# Patient Record
Sex: Female | Born: 1985 | Race: White | Hispanic: No | Marital: Married | Smoking: Never smoker
Health system: Southern US, Community
[De-identification: ages and names within clinical notes are randomized; demographics above are authoritative.]

## PROBLEM LIST (undated history)

## (undated) DIAGNOSIS — N83519 Torsion of ovary and ovarian pedicle, unspecified side: Secondary | ICD-10-CM

## (undated) DIAGNOSIS — E0500 Thyrotoxicosis with diffuse goiter without thyrotoxic crisis or storm: Secondary | ICD-10-CM

## (undated) DIAGNOSIS — IMO0002 Reserved for concepts with insufficient information to code with codable children: Secondary | ICD-10-CM

## (undated) DIAGNOSIS — N83209 Unspecified ovarian cyst, unspecified side: Secondary | ICD-10-CM

## (undated) HISTORY — PX: LAMINECTOMY: SHX219

## (undated) HISTORY — PX: BACK SURGERY: SHX140

## (undated) HISTORY — PX: ANKLE SURGERY: SHX546

## (undated) HISTORY — PX: ELBOW SURGERY: SHX618

---

## 2013-03-18 ENCOUNTER — Ambulatory Visit (INDEPENDENT_AMBULATORY_CARE_PROVIDER_SITE_OTHER): Payer: 59 | Admitting: Sports Medicine

## 2013-03-18 ENCOUNTER — Encounter: Payer: Self-pay | Admitting: Sports Medicine

## 2013-03-18 VITALS — BP 123/84 | HR 97 | Ht 62.0 in | Wt 130.0 lb

## 2013-03-18 DIAGNOSIS — M549 Dorsalgia, unspecified: Secondary | ICD-10-CM

## 2013-03-18 DIAGNOSIS — M62830 Muscle spasm of back: Secondary | ICD-10-CM

## 2013-03-18 DIAGNOSIS — M25551 Pain in right hip: Secondary | ICD-10-CM | POA: Insufficient documentation

## 2013-03-18 DIAGNOSIS — M25559 Pain in unspecified hip: Secondary | ICD-10-CM

## 2013-03-18 DIAGNOSIS — M5380 Other specified dorsopathies, site unspecified: Secondary | ICD-10-CM

## 2013-03-18 DIAGNOSIS — M5126 Other intervertebral disc displacement, lumbar region: Secondary | ICD-10-CM | POA: Insufficient documentation

## 2013-03-18 MED ORDER — GABAPENTIN 300 MG PO CAPS: 300.0000 mg | ORAL_CAPSULE | Freq: Every day | ORAL | Status: DC

## 2013-03-18 NOTE — Assessment & Plan Note (Signed)
She is a compensatory muscle spasm the thoracic spine secondary to her lumbar disc herniation. Physical therapy and home exercises should help alleviate this problem.

## 2013-03-18 NOTE — Progress Notes (Signed)
  Subjective:    Patient ID: Dawn Maldonado, female    DOB: Oct 28, 1985, 27 y.o.   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  She completed anterior right hip pain for the past 3-4 years. Occasionally experiences a popping sensation with external rotation of her hip. The pain with internal rotation and abduction of her right hip. She denies any prior injury. She has had no prior evaluation for this. She reports it to be a nagging pain it has not limited her activity significantly.  She complains of right lower back pain which started in an accident 2 years ago. Showed L5 disc herniation. She has had several injections epidural injections into the disc. These have provided little to no relief. She occasionally reports a burning sensation in her right toe.  The burning sensation is occasionally bothersome at night as well.  No new weakness.  She complains of left lower thoracic back pain over the past several months with no significant injury. The pain is mild and only intermittent. She suspects this may be compensatory secondary to the pain she is experiencing on the right.  Past medical history: Graves' disease currently in remission.  Past surgical history: Epidural injections secondary to lumbar disc herniation  Review of Systems  Constitutional: Positive for activity change. Negative for fatigue and unexpected weight change.  Respiratory: Negative for cough and shortness of breath.   Cardiovascular: Negative for leg swelling.  Musculoskeletal: Positive for back pain and arthralgias. Negative for joint swelling and gait problem.  Neurological: Positive for numbness. Negative for weakness.   As per history of present illness and above otherwise negative    Objective:   Physical Exam BP 123/84  Pulse 97  Ht 5\' 2"  (1.575 m)  Wt 130 lb (58.968 kg)  BMI 23.77  kg/m2 This is a well-developed well-nourished 27 year old female awake alert oriented in no acute distress.  Spine: Normal visualization with no scoliosis No vertebral point tenderness ROM intact with flexion and extension Mild left thoracic paraspinous tenderness (T10-12), no spasm felt No Lumbar spasm Negative Straight leg raise  Right Hip: ROM IR: 40 Deg, ER: 45 Deg, Flexion: 120 Deg, Extension: 15 Deg, Abduction: 45 Deg, Adduction: 45 Deg Strength IR: 5/5, ER: 5/5, Flexion: 5/5, Extension: 5/5, Abduction: 5/5, Adduction: 5/5 Pelvic alignment unremarkable to inspection and palpation. Standing hip rotation and gait without trendelenburg / unsteadiness. Greater trochanter without tenderness to palpation. No tenderness over piriformis and greater trochanter. FADIR positive, FABER negative Mild SI joint tenderness and minimal SI movement R>L No sciatic notch tenderness  Musculoskeletal ultrasound was performed of the right hip. Images obtained of the acetabulum and femoral head reveal very mild calcific change to the labrum no evidence of acute tear and no evidence of effusion.

## 2013-03-18 NOTE — Assessment & Plan Note (Signed)
Flexion and core strengthening exercises were recommended. She will start physical therapy to work on this. She will start gabapentin 300 mg at bedtime. Followup in one month for reevaluation

## 2013-03-18 NOTE — Assessment & Plan Note (Signed)
History and examination are consistent with a possible prior hip labral injury. No evidence of effusion at this time. She will continue her current regimen with no intervention at this time.

## 2013-03-18 NOTE — Patient Instructions (Addendum)
  Exercises to perform at home: Pretzel stretching, Pelvic tilts, Flexion series exercises Hip rotation exercises Quadraped exercises

## 2013-03-26 ENCOUNTER — Ambulatory Visit: Payer: 59 | Attending: Sports Medicine | Admitting: Physical Therapy

## 2013-03-26 DIAGNOSIS — M545 Low back pain, unspecified: Secondary | ICD-10-CM | POA: Insufficient documentation

## 2013-03-26 DIAGNOSIS — IMO0001 Reserved for inherently not codable concepts without codable children: Secondary | ICD-10-CM | POA: Insufficient documentation

## 2013-03-26 DIAGNOSIS — M25559 Pain in unspecified hip: Secondary | ICD-10-CM | POA: Insufficient documentation

## 2013-04-02 ENCOUNTER — Ambulatory Visit: Payer: 59 | Admitting: Physical Therapy

## 2013-04-09 ENCOUNTER — Ambulatory Visit: Payer: 59 | Attending: Sports Medicine | Admitting: Physical Therapy

## 2013-04-09 DIAGNOSIS — M545 Low back pain, unspecified: Secondary | ICD-10-CM | POA: Insufficient documentation

## 2013-04-09 DIAGNOSIS — M25559 Pain in unspecified hip: Secondary | ICD-10-CM | POA: Insufficient documentation

## 2013-04-09 DIAGNOSIS — IMO0001 Reserved for inherently not codable concepts without codable children: Secondary | ICD-10-CM | POA: Insufficient documentation

## 2013-04-16 ENCOUNTER — Ambulatory Visit: Payer: 59 | Admitting: Physical Therapy

## 2013-04-20 ENCOUNTER — Ambulatory Visit (INDEPENDENT_AMBULATORY_CARE_PROVIDER_SITE_OTHER): Payer: 59 | Admitting: Sports Medicine

## 2013-04-20 ENCOUNTER — Encounter: Payer: Self-pay | Admitting: Sports Medicine

## 2013-04-20 VITALS — BP 117/75 | HR 98 | Ht 62.0 in | Wt 130.0 lb

## 2013-04-20 DIAGNOSIS — M5126 Other intervertebral disc displacement, lumbar region: Secondary | ICD-10-CM

## 2013-04-20 DIAGNOSIS — M549 Dorsalgia, unspecified: Secondary | ICD-10-CM

## 2013-04-20 MED ORDER — GABAPENTIN 300 MG PO CAPS: 600.0000 mg | ORAL_CAPSULE | Freq: Three times a day (TID) | ORAL | Status: DC

## 2013-04-20 NOTE — Assessment & Plan Note (Addendum)
Assessment: no weakness, only sensation problems related to nerve irritation of L5/S1 Plan: Continue PT once per week focusing on improving flexion; titrate gabapentin by 300 mg each week as tolerated to effect; f/u in 6 weeks  I do not see findings to suggest that surgical intervention is likely to be helpful  If she does get any weakness consider repeat MRI

## 2013-04-20 NOTE — Patient Instructions (Signed)
Dear Dawn Maldonado,   It was nice to meet you today. I am sorry that you are having continued pain. The best option at this point is to titrate the gabapentin by 300 mg per week. If it makes you fatigued, then stay at that dose.   Please follow up in 6 weeks.   Sincerely,   Dr. Clinton Sawyer

## 2013-04-20 NOTE — Progress Notes (Signed)
  Subjective:    Patient ID: Dawn Maldonado, female    DOB: 11/28/1985, 27 y.o.   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 She was seen on 03/18/13 and patient was encouraged to work on core strengthening and start taking Gabapentin 300 mg at bedtime for pain presumed 2/2 lumbar disc herniation residual. She was also recommended home PT exercises for treatment of muscle spasm.   In past she has failed to get relief with 3 Lumbar spine CSI injections  Today she reports that she has had no improvement or worsening of pain. Her pain is described as burning in her calf and foot. It is worsened by flexion exercises and sitting for a prolonged period of time. She denies any excess fatigue or side effects from gabapentin. She denies any new weakness or trauma.   Review of Systems See HPI      Objective:   Physical Exam BP 117/75  Pulse 98  Ht 5\' 2"  (1.575 m)  Wt 130 lb (58.968 kg)  BMI 23.77 kg/m2  Gen: well appearing, non distressed, pleasant and conversant Hip: right normal internal and external rotation, full flexion of hip with 5/5 strength of hip flexor and hip abductors  Back: negative straight leg raise test, negative faber's test, SI joint with normal laxity Neuro: sensation in touch to light touch and equal bilaterally on lower extremities, 2+ deep tendon reflexes of patella and achilles bilaterally, full strength of dorsi and plantar flexion of great toe on right, no atrophy of right calf muscles   Note her hip abduction is strong now      Assessment & Plan:

## 2013-04-23 ENCOUNTER — Ambulatory Visit: Payer: 59 | Admitting: Physical Therapy

## 2013-04-30 ENCOUNTER — Ambulatory Visit: Payer: 59 | Admitting: Physical Therapy

## 2013-05-12 ENCOUNTER — Ambulatory Visit (INDEPENDENT_AMBULATORY_CARE_PROVIDER_SITE_OTHER): Payer: 59 | Admitting: Sports Medicine

## 2013-05-12 ENCOUNTER — Telehealth: Payer: Self-pay | Admitting: *Deleted

## 2013-05-12 VITALS — BP 128/84 | Ht 62.0 in | Wt 130.0 lb

## 2013-05-12 DIAGNOSIS — M5126 Other intervertebral disc displacement, lumbar region: Secondary | ICD-10-CM

## 2013-05-12 DIAGNOSIS — M5416 Radiculopathy, lumbar region: Secondary | ICD-10-CM

## 2013-05-12 DIAGNOSIS — M51369 Other intervertebral disc degeneration, lumbar region without mention of lumbar back pain or lower extremity pain: Secondary | ICD-10-CM

## 2013-05-12 DIAGNOSIS — IMO0002 Reserved for concepts with insufficient information to code with codable children: Secondary | ICD-10-CM

## 2013-05-12 MED ORDER — AMITRIPTYLINE HCL 25 MG PO TABS: 25.0000 mg | ORAL_TABLET | Freq: Every day | ORAL | Status: DC

## 2013-05-12 NOTE — Telephone Encounter (Signed)
Message copied by Mora Bellman on Wed May 12, 2013 10:46 AM ------      Message from: CERESI, Shawna Orleans L      Created: Wed May 12, 2013  8:32 AM      Regarding: phone message       Pt is having increase pain and numbness rt foot/leg. She is concerned about the new symptoms.  Please call work number [phone]  ------

## 2013-05-12 NOTE — Telephone Encounter (Signed)
Pt able to come in for an appt this morning.

## 2013-05-12 NOTE — Progress Notes (Signed)
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 She has had a known bulging disc with L5-S1 symptoms for the last 2 years. She has had some chronic burning type pain in her right foot. However, in the last week she has had new symptoms. These include numbness of her right foot, increased burning pain in her right foot, and now some burning on her lateral right thigh. She notes her symptoms are worsened by sitting. More concerning like, she has developed some symptoms in her left foot now. She is having left foot burning pain and numbness. She denies any specific weakness but states that her right leg feels heavy. Her pain does improve some with walking but has been relatively constant recently. She denies any bowel or bladder symptoms or any fever.  ROS: As above in the HPI. All other systems are stable or negative.  OBJECTIVE: APPEARANCE:  Patient in no acute distress.The patient appeared well nourished and normally developed. HEENT: No scleral icterus. Conjunctiva non-injected Resp: Non labored Skin: No rash MSK:  Low back exam: - Full range of motion in flexion, extension, lateral bending, rotation without pain  - Minor tenderness to palpation at L5-S1 lumbar vertebra  - No tenderness to palpation at the SI joint or sciatic notch - Strength 5 out of 5 in the bilateral lower extremities - Reflexes 2+ bilaterally at patella kneecap   ASSESSMENT: #1. Known chronic lumbar disc herniation with L5-S1 right-sided symptoms with acute development of worsening right-sided symptoms and new left sided symptoms   PLAN: We will obtain an MRI to evaluate for any central disc bulge as this will likely alter course of management. This was scheduled for October 17. We will call patient with results of her study. In the meantime, we would like her to try to increase her  gabapentin to 600 mg each bedtime and 300 mg every morning. She was also given a prescription for amitriptyline 25 mg to take instead of the extra morning dose of gabapentin if she does not tolerate the gabapentin. Followup plan based on MRI results.

## 2013-05-12 NOTE — Telephone Encounter (Signed)
Per Dr. Darrick Penna - asked her if she could come for a work in appt this morning.  She will check her schedule as she is seeing pts.  If she cannot come in the morning, Dr. Darrick Penna said he will call her back this afternoon.

## 2013-05-12 NOTE — Patient Instructions (Addendum)
Thank you for coming in today  We will get an MRI of your back to see if there has been any change Try taking the gabapentin 600 mg at night and 300 mg in the morning. If this makes you too drowsy during the day we could try adding amitriptyline.  We will call you with results of MRI.  Add isometric back extensors and pelvic tilts.  You have been scheduled for a MRI at [facility] on 05/21/13 at 11 am, please arrive at 10:45am.   Please go to radiology on the 1st floor

## 2013-05-14 ENCOUNTER — Encounter: Payer: 59 | Admitting: Physical Therapy

## 2013-05-18 ENCOUNTER — Ambulatory Visit (HOSPITAL_COMMUNITY)
Admission: RE | Admit: 2013-05-18 | Discharge: 2013-05-18 | Disposition: A | Discharge status: Home / Self Care | Payer: 59 | Source: Ambulatory Visit | Attending: Family Medicine | Admitting: Family Medicine

## 2013-05-18 DIAGNOSIS — N83209 Unspecified ovarian cyst, unspecified side: Secondary | ICD-10-CM | POA: Insufficient documentation

## 2013-05-18 DIAGNOSIS — M5416 Radiculopathy, lumbar region: Secondary | ICD-10-CM

## 2013-05-18 DIAGNOSIS — M5137 Other intervertebral disc degeneration, lumbosacral region: Secondary | ICD-10-CM | POA: Insufficient documentation

## 2013-05-18 DIAGNOSIS — M79609 Pain in unspecified limb: Secondary | ICD-10-CM | POA: Insufficient documentation

## 2013-05-18 DIAGNOSIS — M51379 Other intervertebral disc degeneration, lumbosacral region without mention of lumbar back pain or lower extremity pain: Secondary | ICD-10-CM | POA: Insufficient documentation

## 2013-05-18 DIAGNOSIS — R209 Unspecified disturbances of skin sensation: Secondary | ICD-10-CM | POA: Insufficient documentation

## 2013-05-18 DIAGNOSIS — M5126 Other intervertebral disc displacement, lumbar region: Secondary | ICD-10-CM | POA: Insufficient documentation

## 2013-05-21 ENCOUNTER — Ambulatory Visit (HOSPITAL_COMMUNITY): Payer: 59

## 2013-05-21 ENCOUNTER — Ambulatory Visit: Payer: 59 | Attending: Sports Medicine | Admitting: Physical Therapy

## 2013-05-21 DIAGNOSIS — M545 Low back pain, unspecified: Secondary | ICD-10-CM | POA: Insufficient documentation

## 2013-05-21 DIAGNOSIS — IMO0001 Reserved for inherently not codable concepts without codable children: Secondary | ICD-10-CM | POA: Insufficient documentation

## 2013-05-21 DIAGNOSIS — M25559 Pain in unspecified hip: Secondary | ICD-10-CM | POA: Insufficient documentation

## 2013-05-28 ENCOUNTER — Ambulatory Visit: Payer: 59 | Admitting: Physical Therapy

## 2013-06-02 ENCOUNTER — Ambulatory Visit: Payer: 59 | Admitting: Sports Medicine

## 2013-06-04 ENCOUNTER — Ambulatory Visit: Payer: 59 | Admitting: Physical Therapy

## 2013-10-26 ENCOUNTER — Other Ambulatory Visit (HOSPITAL_COMMUNITY): Payer: Self-pay | Admitting: Neurosurgery

## 2013-10-26 DIAGNOSIS — M5416 Radiculopathy, lumbar region: Secondary | ICD-10-CM

## 2013-11-05 ENCOUNTER — Ambulatory Visit (HOSPITAL_COMMUNITY)
Admission: RE | Admit: 2013-11-05 | Discharge: 2013-11-05 | Disposition: A | Discharge status: Home / Self Care | Payer: 59 | Source: Ambulatory Visit | Attending: Neurosurgery | Admitting: Neurosurgery

## 2013-11-05 DIAGNOSIS — M5137 Other intervertebral disc degeneration, lumbosacral region: Secondary | ICD-10-CM | POA: Insufficient documentation

## 2013-11-05 DIAGNOSIS — M79609 Pain in unspecified limb: Secondary | ICD-10-CM | POA: Insufficient documentation

## 2013-11-05 DIAGNOSIS — IMO0002 Reserved for concepts with insufficient information to code with codable children: Secondary | ICD-10-CM | POA: Insufficient documentation

## 2013-11-05 DIAGNOSIS — M5126 Other intervertebral disc displacement, lumbar region: Secondary | ICD-10-CM | POA: Insufficient documentation

## 2013-11-05 DIAGNOSIS — M545 Low back pain, unspecified: Secondary | ICD-10-CM | POA: Insufficient documentation

## 2013-11-05 DIAGNOSIS — M51379 Other intervertebral disc degeneration, lumbosacral region without mention of lumbar back pain or lower extremity pain: Secondary | ICD-10-CM | POA: Insufficient documentation

## 2013-11-05 DIAGNOSIS — M5416 Radiculopathy, lumbar region: Secondary | ICD-10-CM

## 2013-11-09 ENCOUNTER — Other Ambulatory Visit (HOSPITAL_COMMUNITY): Payer: 59

## 2013-12-27 ENCOUNTER — Emergency Department (HOSPITAL_COMMUNITY): Payer: 59 | Admitting: Anesthesiology

## 2013-12-27 ENCOUNTER — Emergency Department (HOSPITAL_COMMUNITY): Payer: 59

## 2013-12-27 ENCOUNTER — Encounter (HOSPITAL_COMMUNITY): Admission: EM | Disposition: A | Discharge status: Home / Self Care | Payer: Self-pay | Source: Home / Self Care

## 2013-12-27 ENCOUNTER — Encounter (HOSPITAL_COMMUNITY): Payer: Self-pay | Admitting: Emergency Medicine

## 2013-12-27 ENCOUNTER — Ambulatory Visit (HOSPITAL_COMMUNITY)
Admission: EM | Admit: 2013-12-27 | Discharge: 2013-12-27 | Disposition: A | Discharge status: Home / Self Care | Payer: 59 | Attending: Obstetrics | Admitting: Obstetrics

## 2013-12-27 ENCOUNTER — Encounter (HOSPITAL_COMMUNITY): Payer: 59 | Admitting: Anesthesiology

## 2013-12-27 DIAGNOSIS — N949 Unspecified condition associated with female genital organs and menstrual cycle: Secondary | ICD-10-CM | POA: Insufficient documentation

## 2013-12-27 DIAGNOSIS — N83519 Torsion of ovary and ovarian pedicle, unspecified side: Secondary | ICD-10-CM

## 2013-12-27 DIAGNOSIS — E0500 Thyrotoxicosis with diffuse goiter without thyrotoxic crisis or storm: Secondary | ICD-10-CM | POA: Insufficient documentation

## 2013-12-27 DIAGNOSIS — N8353 Torsion of ovary, ovarian pedicle and fallopian tube: Secondary | ICD-10-CM | POA: Insufficient documentation

## 2013-12-27 HISTORY — DX: Torsion of ovary and ovarian pedicle, unspecified side: N83.519

## 2013-12-27 HISTORY — PX: LAPAROSCOPY: SHX197

## 2013-12-27 HISTORY — DX: Reserved for concepts with insufficient information to code with codable children: IMO0002

## 2013-12-27 HISTORY — DX: Thyrotoxicosis with diffuse goiter without thyrotoxic crisis or storm: E05.00

## 2013-12-27 HISTORY — DX: Unspecified ovarian cyst, unspecified side: N83.209

## 2013-12-27 LAB — COMPREHENSIVE METABOLIC PANEL
ALT: 9 U/L (ref 0–35)
AST: 19 U/L (ref 0–37)
Albumin: 4.1 g/dL (ref 3.5–5.2)
Alkaline Phosphatase: 43 U/L (ref 39–117)
BUN: 18 mg/dL (ref 6–23)
CHLORIDE: 103 meq/L (ref 96–112)
CO2: 24 meq/L (ref 19–32)
Calcium: 8.9 mg/dL (ref 8.4–10.5)
Creatinine, Ser: 0.72 mg/dL (ref 0.50–1.10)
GFR calc Af Amer: >90 mL/min (ref >90)
Glucose, Bld: 139 mg/dL — ABNORMAL HIGH (ref 70–99)
Potassium: 3.9 mEq/L (ref 3.7–5.3)
SODIUM: 139 meq/L (ref 137–147)
Total Protein: 7.1 g/dL (ref 6.0–8.3)

## 2013-12-27 LAB — TYPE AND SCREEN
ABO/RH(D): B NEG
Antibody Screen: NEGATIVE

## 2013-12-27 LAB — CBC WITH DIFFERENTIAL/PLATELET
BASOS ABS: 0 10*3/uL (ref 0.0–0.1)
BASOS PCT: 0 % (ref 0–1)
Basophils Absolute: 0 10*3/uL (ref 0.0–0.1)
Basophils Relative: 0 % (ref 0–1)
EOS ABS: 0 10*3/uL (ref 0.0–0.7)
Eosinophils Absolute: 0 10*3/uL (ref 0.0–0.7)
Eosinophils Relative: 0 % (ref 0–5)
Eosinophils Relative: 0 % (ref 0–5)
HCT: 37.6 % (ref 36.0–46.0)
HEMATOCRIT: 39.2 % (ref 36.0–46.0)
HEMOGLOBIN: 13.3 g/dL (ref 12.0–15.0)
Hemoglobin: 13.3 g/dL (ref 12.0–15.0)
LYMPHS ABS: 0.8 10*3/uL (ref 0.7–4.0)
LYMPHS PCT: 14 % (ref 12–46)
Lymphocytes Relative: 8 % — ABNORMAL LOW (ref 12–46)
Lymphs Abs: 1.2 10*3/uL (ref 0.7–4.0)
MCH: 30.2 pg (ref 26.0–34.0)
MCH: 31.1 pg (ref 26.0–34.0)
MCHC: 33.9 g/dL (ref 30.0–36.0)
MCHC: 35.4 g/dL (ref 30.0–36.0)
MCV: 89.1 fL (ref 78.0–100.0)
MCV: 88.1 fL (ref 78.0–100.0)
MONO ABS: 0.2 10*3/uL (ref 0.1–1.0)
MONOS PCT: 2 % — AB (ref 3–12)
Monocytes Absolute: 0.4 10*3/uL (ref 0.1–1.0)
Monocytes Relative: 4 % (ref 3–12)
NEUTROS ABS: 7.5 10*3/uL (ref 1.7–7.7)
NEUTROS PCT: 90 % — AB (ref 43–77)
Neutro Abs: 9.9 10*3/uL — ABNORMAL HIGH (ref 1.7–7.7)
Neutrophils Relative %: 82 % — ABNORMAL HIGH (ref 43–77)
PLATELETS: 258 10*3/uL (ref 150–400)
Platelets: 241 10*3/uL (ref 150–400)
RBC: 4.4 MIL/uL (ref 3.87–5.11)
RBC: 4.27 MIL/uL (ref 3.87–5.11)
RDW: 11.6 % (ref 11.5–15.5)
RDW: 11.5 % (ref 11.5–15.5)
WBC: 9.1 10*3/uL (ref 4.0–10.5)
WBC: 10.9 10*3/uL — ABNORMAL HIGH (ref 4.0–10.5)

## 2013-12-27 LAB — URINALYSIS, ROUTINE W REFLEX MICROSCOPIC
BILIRUBIN URINE: NEGATIVE
Glucose, UA: NEGATIVE mg/dL
Hgb urine dipstick: NEGATIVE
KETONES UR: 15 mg/dL — AB
LEUKOCYTES UA: NEGATIVE
Nitrite: NEGATIVE
PH: 7.5 (ref 5.0–8.0)
Protein, ur: NEGATIVE mg/dL
Specific Gravity, Urine: 1.024 (ref 1.005–1.030)
Urobilinogen, UA: 1 mg/dL (ref 0.0–1.0)

## 2013-12-27 LAB — WET PREP, GENITAL
Clue Cells Wet Prep HPF POC: NONE SEEN
TRICH WET PREP: NONE SEEN
WBC, Wet Prep HPF POC: NONE SEEN
YEAST WET PREP: NONE SEEN

## 2013-12-27 LAB — ABO/RH: ABO/RH(D): B NEG

## 2013-12-27 LAB — LIPASE, BLOOD: Lipase: 58 U/L (ref 11–59)

## 2013-12-27 LAB — POC URINE PREG, ED: Preg Test, Ur: NEGATIVE

## 2013-12-27 SURGERY — LAPAROSCOPY, DIAGNOSTIC
Anesthesia: General | Site: Abdomen | Laterality: Right

## 2013-12-27 MED ORDER — HEPARIN SODIUM (PORCINE) 5000 UNIT/ML IJ SOLN
INTRAMUSCULAR | Status: DC | PRN
  Administered 2013-12-27: 5000 [IU]

## 2013-12-27 MED ORDER — MIDAZOLAM HCL 2 MG/2ML IJ SOLN: 0.5000 mg | Freq: Once | INTRAMUSCULAR | Status: DC | PRN

## 2013-12-27 MED ORDER — KETOROLAC TROMETHAMINE 30 MG/ML IJ SOLN
INTRAMUSCULAR | Status: DC | PRN
  Administered 2013-12-27: 30 mg via INTRAVENOUS

## 2013-12-27 MED ORDER — KETOROLAC TROMETHAMINE 30 MG/ML IJ SOLN: 15.0000 mg | Freq: Once | INTRAMUSCULAR | Status: DC | PRN

## 2013-12-27 MED ORDER — DOCUSATE SODIUM 100 MG PO CAPS: 100.0000 mg | ORAL_CAPSULE | Freq: Two times a day (BID) | ORAL | Status: DC

## 2013-12-27 MED ORDER — SUCCINYLCHOLINE CHLORIDE 20 MG/ML IJ SOLN
INTRAMUSCULAR | Status: DC | PRN
  Administered 2013-12-27: 100 mg via INTRAVENOUS

## 2013-12-27 MED ORDER — LACTATED RINGERS IV SOLN
INTRAVENOUS | Status: DC
  Administered 2013-12-27 (×2): via INTRAVENOUS

## 2013-12-27 MED ORDER — SODIUM CHLORIDE 0.9 % IV BOLUS (SEPSIS)
1000.0000 mL | Freq: Once | INTRAVENOUS | Status: AC
  Administered 2013-12-27: 1000 mL via INTRAVENOUS

## 2013-12-27 MED ORDER — PROMETHAZINE HCL 25 MG/ML IJ SOLN: 6.2500 mg | INTRAMUSCULAR | Status: DC | PRN

## 2013-12-27 MED ORDER — HYDROMORPHONE HCL PF 1 MG/ML IJ SOLN
1.0000 mg | Freq: Once | INTRAMUSCULAR | Status: AC
  Administered 2013-12-27: 1 mg via INTRAVENOUS
  Filled 2013-12-27: qty 1

## 2013-12-27 MED ORDER — OXYCODONE-ACETAMINOPHEN 5-325 MG PO TABS: 2.0000 | ORAL_TABLET | ORAL | Status: DC | PRN

## 2013-12-27 MED ORDER — ROCURONIUM BROMIDE 100 MG/10ML IV SOLN
INTRAVENOUS | Status: DC | PRN
  Administered 2013-12-27: 25 mg via INTRAVENOUS
  Administered 2013-12-27: 10 mg via INTRAVENOUS
  Administered 2013-12-27 (×4): 5 mg via INTRAVENOUS

## 2013-12-27 MED ORDER — CEFAZOLIN SODIUM-DEXTROSE 2-3 GM-% IV SOLR
2.0000 g | Freq: Once | INTRAVENOUS | Status: AC
  Administered 2013-12-27: 2 g via INTRAVENOUS
  Filled 2013-12-27 (×2): qty 50

## 2013-12-27 MED ORDER — FENTANYL CITRATE 0.05 MG/ML IJ SOLN
INTRAMUSCULAR | Status: DC | PRN
  Administered 2013-12-27: 50 ug via INTRAVENOUS
  Administered 2013-12-27: 100 ug via INTRAVENOUS
  Administered 2013-12-27 (×2): 50 ug via INTRAVENOUS

## 2013-12-27 MED ORDER — NEOSTIGMINE METHYLSULFATE 10 MG/10ML IV SOLN
INTRAVENOUS | Status: DC | PRN
  Administered 2013-12-27: 3 mg via INTRAVENOUS

## 2013-12-27 MED ORDER — HEPARIN SODIUM (PORCINE) 5000 UNIT/ML IJ SOLN
INTRAMUSCULAR | Status: AC
  Filled 2013-12-27: qty 1

## 2013-12-27 MED ORDER — ONDANSETRON 4 MG PO TBDP
8.0000 mg | ORAL_TABLET | Freq: Once | ORAL | Status: AC
  Administered 2013-12-27: 8 mg via ORAL
  Filled 2013-12-27: qty 2

## 2013-12-27 MED ORDER — IOHEXOL 300 MG/ML  SOLN
25.0000 mL | Freq: Once | INTRAMUSCULAR | Status: AC | PRN
  Administered 2013-12-27: 25 mL via ORAL

## 2013-12-27 MED ORDER — MORPHINE SULFATE 4 MG/ML IJ SOLN
4.0000 mg | Freq: Once | INTRAMUSCULAR | Status: AC
  Administered 2013-12-27: 4 mg via INTRAVENOUS
  Filled 2013-12-27: qty 1

## 2013-12-27 MED ORDER — CITRIC ACID-SODIUM CITRATE 334-500 MG/5ML PO SOLN
30.0000 mL | Freq: Once | ORAL | Status: AC
  Administered 2013-12-27: 30 mL via ORAL
  Filled 2013-12-27: qty 15

## 2013-12-27 MED ORDER — BUPIVACAINE HCL (PF) 0.25 % IJ SOLN
INTRAMUSCULAR | Status: DC | PRN
  Administered 2013-12-27: 30 mL

## 2013-12-27 MED ORDER — GLYCOPYRROLATE 0.2 MG/ML IJ SOLN
INTRAMUSCULAR | Status: DC | PRN
  Administered 2013-12-27: .4 mg via INTRAVENOUS

## 2013-12-27 MED ORDER — KETOROLAC TROMETHAMINE 30 MG/ML IJ SOLN
30.0000 mg | Freq: Once | INTRAMUSCULAR | Status: AC
  Administered 2013-12-27: 30 mg via INTRAVENOUS
  Filled 2013-12-27: qty 1

## 2013-12-27 MED ORDER — IOHEXOL 300 MG/ML  SOLN
80.0000 mL | Freq: Once | INTRAMUSCULAR | Status: AC | PRN
  Administered 2013-12-27: 80 mL via INTRAVENOUS

## 2013-12-27 MED ORDER — HYDROMORPHONE HCL PF 1 MG/ML IJ SOLN: 0.2500 mg | INTRAMUSCULAR | Status: DC | PRN

## 2013-12-27 MED ORDER — ONDANSETRON HCL 4 MG/2ML IJ SOLN
INTRAMUSCULAR | Status: DC | PRN
  Administered 2013-12-27: 4 mg via INTRAVENOUS

## 2013-12-27 MED ORDER — MIDAZOLAM HCL 2 MG/2ML IJ SOLN
INTRAMUSCULAR | Status: DC | PRN
  Administered 2013-12-27: 2 mg via INTRAVENOUS

## 2013-12-27 MED ORDER — FAMOTIDINE IN NACL 20-0.9 MG/50ML-% IV SOLN
20.0000 mg | Freq: Once | INTRAVENOUS | Status: AC
  Administered 2013-12-27: 20 mg via INTRAVENOUS
  Filled 2013-12-27: qty 50

## 2013-12-27 MED ORDER — LIDOCAINE HCL (CARDIAC) 20 MG/ML IV SOLN
INTRAVENOUS | Status: DC | PRN
  Administered 2013-12-27: 60 mg via INTRAVENOUS

## 2013-12-27 MED ORDER — ONDANSETRON HCL 4 MG/2ML IJ SOLN
4.0000 mg | INTRAMUSCULAR | Status: AC
  Administered 2013-12-27: 4 mg via INTRAVENOUS
  Filled 2013-12-27: qty 2

## 2013-12-27 MED ORDER — PROPOFOL 10 MG/ML IV BOLUS
INTRAVENOUS | Status: DC | PRN
  Administered 2013-12-27: 200 mg via INTRAVENOUS

## 2013-12-27 MED ORDER — MEPERIDINE HCL 25 MG/ML IJ SOLN: 6.2500 mg | INTRAMUSCULAR | Status: DC | PRN

## 2013-12-27 MED ORDER — LACTATED RINGERS IR SOLN
Status: DC | PRN
  Administered 2013-12-27: 3000 mL

## 2013-12-27 SURGICAL SUPPLY — 33 items
BLADE 15 SAFETY STRL DISP (BLADE) ×3 IMPLANT
CABLE HIGH FREQUENCY MONO STRZ (ELECTRODE) IMPLANT
CHLORAPREP W/TINT 26ML (MISCELLANEOUS) ×3 IMPLANT
CLOTH BEACON ORANGE TIMEOUT ST (SAFETY) ×3 IMPLANT
DERMABOND ADVANCED (GAUZE/BANDAGES/DRESSINGS) ×2
DERMABOND ADVANCED .7 DNX12 (GAUZE/BANDAGES/DRESSINGS) ×1 IMPLANT
ELECT NEEDLE TIP 2.8 STRL (NEEDLE) ×3 IMPLANT
FILTER SMOKE EVAC LAPAROSHD (FILTER) ×3 IMPLANT
FORCEPS CUTTING 33CM 5MM (CUTTING FORCEPS) ×3 IMPLANT
GLOVE BIO SURGEON STRL SZ 6.5 (GLOVE) ×2 IMPLANT
GLOVE BIO SURGEONS STRL SZ 6.5 (GLOVE) ×1
GLOVE BIOGEL PI IND STRL 7.0 (GLOVE) ×1 IMPLANT
GLOVE BIOGEL PI INDICATOR 7.0 (GLOVE) ×2
GOWN STRL REUS W/TWL LRG LVL3 (GOWN DISPOSABLE) ×9 IMPLANT
MANIPULATOR UTERINE 4.5 ZUMI (MISCELLANEOUS) ×3 IMPLANT
NEEDLE INSUFFLATION 120MM (ENDOMECHANICALS) ×3 IMPLANT
PACK LAPAROSCOPY BASIN (CUSTOM PROCEDURE TRAY) ×3 IMPLANT
PENCIL BUTTON HOLSTER BLD 10FT (ELECTRODE) ×3 IMPLANT
POUCH SPECIMEN RETRIEVAL 10MM (ENDOMECHANICALS) ×3 IMPLANT
PROTECTOR NERVE ULNAR (MISCELLANEOUS) ×3 IMPLANT
SCISSORS LAP 5X35 DISP (ENDOMECHANICALS) ×3 IMPLANT
SET IRRIG TUBING LAPAROSCOPIC (IRRIGATION / IRRIGATOR) ×3 IMPLANT
SOLUTION ELECTROLUBE (MISCELLANEOUS) IMPLANT
SUT VICRYL 0 UR6 27IN ABS (SUTURE) ×3 IMPLANT
SUT VICRYL 4-0 PS2 18IN ABS (SUTURE) ×6 IMPLANT
SYR 5ML LL (SYRINGE) ×3 IMPLANT
TOWEL OR 17X24 6PK STRL BLUE (TOWEL DISPOSABLE) ×6 IMPLANT
TRAY FOLEY CATH 14FR (SET/KITS/TRAYS/PACK) ×3 IMPLANT
TROCAR BALLN 12MMX100 BLUNT (TROCAR) IMPLANT
TROCAR XCEL NON-BLD 11X100MML (ENDOMECHANICALS) ×3 IMPLANT
TROCAR XCEL NON-BLD 5MMX100MML (ENDOMECHANICALS) ×6 IMPLANT
WARMER LAPAROSCOPE (MISCELLANEOUS) ×3 IMPLANT
WATER STERILE IRR 1000ML POUR (IV SOLUTION) ×3 IMPLANT

## 2013-12-27 NOTE — Anesthesia Postprocedure Evaluation (Signed)
  Anesthesia Post Note  Patient: Dawn Maldonado  Procedure(s) Performed: Procedure(s) (LRB): LAPAROSCOPIC SALPINGO OOPHORECTOMY (Right)  Anesthesia type: GA  Patient location: PACU  Post pain: Pain level controlled  Post assessment: Post-op Vital signs reviewed  Last Vitals:  Filed Vitals:   12/27/13 1345  BP: 108/66  Pulse: 93  Temp:   Resp: 19    Post vital signs: Reviewed  Level of consciousness: sedated  Complications: No apparent anesthesia complications

## 2013-12-27 NOTE — ED Notes (Signed)
RLQ pain starting 3 hours ago described as a severe sharp pain that travel to back and down to vagina. Sitting up and lying on back makes pain worse. LMP a week ago, states was light.

## 2013-12-27 NOTE — ED Provider Notes (Signed)
CSN: [id number]     Arrival date & time 12/27/13  0037 History   First MD Initiated Contact with Patient 12/27/13 0133     Chief Complaint  Patient presents with  . Abdominal Pain    (Consider location/radiation/quality/duration/timing/severity/associated sxs/prior Treatment) HPI Comments: Patient is a 28 year old female with a history of laminectomy 5 weeks ago who presents to the emergency department for right lower quadrant pain with onset 3 hours ago. Patient states the pain is severe and sharp in nature and travels to her left lower quadrant, around to her right back, and feels as though it radiates down to her vagina. Patient states the pain is worse when lying on her back and when sitting up. Pain was not alleviated by Percocet prior to arrival. Patient states the symptoms have been associated with chills, nausea, nonbloody emesis x3, and diaphoresis. Patient denies associated fever, chest pain, shortness of breath, syncope, dysuria, hematuria, vaginal bleeding, vaginal discharge, diarrhea, melena, hematochezia, numbness/tingling, weakness, and bowel/bladder incontinence. Patient had a bowel movement within the last 24 hours which was normal in color and consistency. LMP 1.5 weeks ago. No hx of abdominal surgeries.  Patient is a 28 y.o. female presenting with abdominal pain. The history is provided by the patient. No language interpreter was used.  Abdominal Pain Associated symptoms: chills, nausea and vomiting   Associated symptoms: no chest pain, no diarrhea, no dysuria, no hematuria, no shortness of breath, no vaginal bleeding and no vaginal discharge     Past Medical History  Diagnosis Date  . Graves disease   . Ovarian cyst    Past Surgical History  Procedure Laterality Date  . Back surgery    . Laminectomy    . Ankle surgery    . Elbow surgery     No family history on file. History  Substance Use Topics  . Smoking status: Never Smoker   . Smokeless tobacco: Never Used   . Alcohol Use: No   OB History   Grav Para Term Preterm Abortions TAB SAB Ect Mult Living                  Review of Systems  Constitutional: Positive for chills and diaphoresis.  Respiratory: Negative for shortness of breath.   Cardiovascular: Negative for chest pain.  Gastrointestinal: Positive for nausea, vomiting and abdominal pain. Negative for diarrhea and blood in stool.  Genitourinary: Negative for dysuria, hematuria, vaginal bleeding and vaginal discharge.  Neurological: Negative for weakness and numbness.  All other systems reviewed and are negative.   Allergies  Review of patient's allergies indicates no known allergies.  Home Medications   Prior to Admission medications   Medication Sig Start Date End Date Taking? Authorizing Provider  HYDROcodone-acetaminophen (NORCO/VICODIN) 5-325 MG per tablet Take 2 tablets by mouth every 6 (six) hours as needed for moderate pain.   Yes Historical Provider, MD  methimazole (TAPAZOLE) 5 MG tablet Take 5 mg by mouth 3 (three) times a week. Monday, Wednesday and friday   Yes Historical Provider, MD  naproxen sodium (ANAPROX) 220 MG tablet Take 440 mg by mouth 2 (two) times daily as needed (pain).   Yes Historical Provider, MD  norethindrone-ethinyl estradiol (JUNEL FE,GILDESS FE,LOESTRIN FE) 1-20 MG-MCG tablet Take 1 tablet by mouth daily.   Yes Historical Provider, MD   BP 99/54  Pulse 80  Temp(Src) 97.6 F (36.4 C) (Oral)  Resp 16  SpO2 95%  LMP 12/13/2013  Physical Exam  Nursing note and vitals reviewed. Constitutional:  She is oriented to person, place, and time. She appears well-developed and well-nourished. No distress.  Nontoxic/nonseptic appearing. Patient does appear uncomfortable.  HENT:  Head: Normocephalic and atraumatic.  Mouth/Throat: Oropharynx is clear and moist. No oropharyngeal exudate.  Eyes: Conjunctivae and EOM are normal. Pupils are equal, round, and reactive to light. No scleral icterus.  Neck: Normal  range of motion.  Cardiovascular: Normal rate, regular rhythm and normal heart sounds.   Pulmonary/Chest: Effort normal and breath sounds normal. No respiratory distress. She has no wheezes. She has no rales.  Abdominal: Soft. She exhibits no distension. There is tenderness. There is no rebound and no guarding.  Abdomen is soft with tenderness to deep palpation appreciated to be at McBurney's point; no exquisite TTP despite endorsed degree of pain. No masses. No peritoneal signs.  Genitourinary: Vagina normal. There is no rash, tenderness, lesion or injury on the right labia. There is no rash, tenderness, lesion or injury on the left labia. Uterus is not tender. Cervix exhibits no motion tenderness, no discharge and no friability. Right adnexum displays tenderness (very mild). Right adnexum displays no mass and no fullness. Left adnexum displays no mass, no tenderness and no fullness.  Musculoskeletal: Normal range of motion.  Well healed midline incision to lumbar spine. No TTP, swelling, erythema, heat to touch, or drainage.  Neurological: She is alert and oriented to person, place, and time.  Patient moves extremities without ataxia. GCS 15. Speech is goal oriented.  Skin: Skin is warm and dry. No rash noted. She is not diaphoretic. No erythema.  Psychiatric: She has a normal mood and affect. Her behavior is normal.    ED Course  Procedures (including critical care time) Labs Review Labs Reviewed  CBC WITH DIFFERENTIAL - Abnormal; Notable for the following:    Neutrophils Relative % 82 (*)    All other components within normal limits  COMPREHENSIVE METABOLIC PANEL - Abnormal; Notable for the following:    Glucose, Bld 139 (*)    Total Bilirubin <0.2 (*)    All other components within normal limits  URINALYSIS, ROUTINE W REFLEX MICROSCOPIC - Abnormal; Notable for the following:    APPearance CLOUDY (*)    Ketones, ur 15 (*)    All other components within normal limits  WET PREP,  GENITAL  GC/CHLAMYDIA PROBE AMP  LIPASE, BLOOD  POC URINE PREG, ED   Imaging Review Ct Abdomen Pelvis W Contrast  12/27/2013   CLINICAL DATA:  Right lower quadrant pain.  EXAM: CT ABDOMEN AND PELVIS WITH CONTRAST  TECHNIQUE: Multidetector CT imaging of the abdomen and pelvis was performed using the standard protocol following bolus administration of intravenous contrast.  CONTRAST:  29mL OMNIPAQUE IOHEXOL 300 MG/ML SOLN, 78mL OMNIPAQUE IOHEXOL 300 MG/ML SOLN  COMPARISON:  DG LUMBAR SPINE 1V dated 11/18/2013; MR L SPINE W/O dated 11/05/2013  FINDINGS: Included view of the lung bases are clear. Visualized heart and pericardium are unremarkable.  The liver, spleen, gallbladder, pancreas and adrenal glands are unremarkable.  The stomach, small and large bowel are normal in course and caliber without inflammatory changes. Moderate amount of retained large bowel stool. Enteric contrast has not yet reached the distal small bowel. Normal air-filled appendix. No intraperitoneal free fluid nor free air.  Kidneys are orthotopic, demonstrating symmetric enhancement without nephrolithiasis, hydronephrosis or renal masses. The unopacified ureters are normal in course and caliber. Urinary bladder is partially distended and unremarkable.  Aortoiliac vessels are normal in course and caliber. No lymphadenopathy by CT size  criteria. The right ovary appears somewhat prominent at 4.9 x 3.9 cm. Grade 1 L5-S1 retrolisthesis with moderate L5-S1 degenerative disc disease, status post right L5 laminectomy.  IMPRESSION: Mildly prominent right adnexae, if clinically indicated this could be further characterized with pelvic ultrasound.  Moderate amount of retained large bowel stool.  Normal appendix.   Electronically Signed   By: Awilda Metro   On: 12/27/2013 04:43     EKG Interpretation None      MDM   Final diagnoses:  Right lower quadrant abdominal pain    28 year old female presents for right lower quadrant pain with  onset 2130 yesterday. Symptoms associated with nausea, vomiting, and chills. Patient is 5 weeks s/p laminectomy for a ruptured disc in lumbar spine. She does endorse a hx of ovarian cysts, but states this pain feels different and her cyst pain has never been this severe. Physical exam significant for tenderness to palpation at McBurney point. Patient appears uncomfortable, though her laboratory workup is reassuring. Vital signs stable. Patient afebrile.  CT abdomen and pelvis ordered for further evaluation of symptoms. CT imaging shows a mildly prominent right adnexa. Given location of patient's symptoms, have ordered pelvic ultrasound for further evaluation and to rule out ovarian torsion. Ultrasound is currently pending. Patient signed out to [facility], PA-C at shift change who will follow up on imaging and disposition appropriately. Believe patient stable for outpatient f/u if no emergent process identified.   Filed Vitals:   12/27/13 0300 12/27/13 0314 12/27/13 0330 12/27/13 0545  BP: 109/70 109/70 103/63 99/54  Pulse: 78 77 79 80  Temp:      TempSrc:      Resp:   15 16  SpO2: 100% 99% 95% 95%       Antony Madura, PA-C 12/27/13 [phone]

## 2013-12-27 NOTE — Progress Notes (Signed)
Faculty Practice OB/GYN Attending Progress Note  Patient was transferred from [facility] ED with concern about ovarian torsion. See below:   12/27/2013   TRANSABDOMINAL AND TRANSVAGINAL ULTRASOUND OF PELVIS  DOPPLER ULTRASOUND OF OVARIES CLINICAL DATA:  Right lower quadrant abdominal pain.  TECHNIQUE: Both transabdominal and transvaginal ultrasound examinations of the pelvis were performed. Transabdominal technique was performed for global imaging of the pelvis including uterus, ovaries, adnexal regions, and pelvic cul-de-sac.  It was necessary to proceed with endovaginal exam following the transabdominal exam to visualize the ovaries and uterus. Color and duplex Doppler ultrasound was utilized to evaluate blood flow to the ovaries.  COMPARISON:  CT scan dated 12/27/2013  FINDINGS: Uterus  Measurements: 5.6 x 3.4 x 3.9 cm. No fibroids or other mass visualized.  Endometrium  Thickness: 4.1 mm.  No focal abnormality visualized.  Right ovary  Measurements: 7.4 x 4.2 x 5.9 cm. The ovary is enlarged and heterogeneous with minimal blood flow. I suspect this represents a partial torsion.  Left ovary  Measurements: 3.2 x 2.1 x 2.3 cm. Simple appearing 2.1 cm cyst. Normal perfusion.  Other findings:  Small amount of free fluid around the right ovary.  IMPRESSION: Enlarged right ovary with poor blood flow consistent with partial tortion.  Critical Value/emergent results were called by telephone at the time of interpretation on 12/27/2013 at 7:26 AM to Dr. Judd Lien  , who verbally acknowledged these results.   Electronically Signed   By: Geanie Cooley M.D.   On: 12/27/2013 07:26   12/27/2013  CT ABDOMEN AND PELVIS WITH CONTRAST  CLINICAL DATA:  Right lower quadrant pain.     TECHNIQUE: Multidetector CT imaging of the abdomen and pelvis was performed using the standard protocol following bolus administration of intravenous contrast.  CONTRAST:  82mL OMNIPAQUE IOHEXOL 300 MG/ML SOLN, 28mL OMNIPAQUE IOHEXOL 300 MG/ML SOLN  COMPARISON:  DG  LUMBAR SPINE 1V dated 11/18/2013; MR L SPINE W/O dated 11/05/2013  FINDINGS: Included view of the lung bases are clear. Visualized heart and pericardium are unremarkable.  The liver, spleen, gallbladder, pancreas and adrenal glands are unremarkable.  The stomach, small and large bowel are normal in course and caliber without inflammatory changes. Moderate amount of retained large bowel stool. Enteric contrast has not yet reached the distal small bowel. Normal air-filled appendix. No intraperitoneal free fluid nor free air.  Kidneys are orthotopic, demonstrating symmetric enhancement without nephrolithiasis, hydronephrosis or renal masses. The unopacified ureters are normal in course and caliber. Urinary bladder is partially distended and unremarkable.  Aortoiliac vessels are normal in course and caliber. No lymphadenopathy by CT size criteria. The right ovary appears somewhat prominent at 4.9 x 3.9 cm. Grade 1 L5-S1 retrolisthesis with moderate L5-S1 degenerative disc disease, status post right L5 laminectomy.  IMPRESSION: Mildly prominent right adnexae, if clinically indicated this could be further characterized with pelvic ultrasound.  Moderate amount of retained large bowel stool.  Normal appendix.   Electronically Signed   By: Awilda Metro   On: 12/27/2013 04:43    Patient reported she is seen at [facility] by Dr. Noland Fordyce.  Dr. Ernestina Penna was called and informed of the patient; she will take over her care.  Of note, Dilaudid was reordered for patient.  OR was already getting ready for patient; they were informed of change of attending/service.    Jaynie Collins, MD, FACOG Attending Obstetrician & Gynecologist Faculty Practice, [facility]

## 2013-12-27 NOTE — ED Notes (Signed)
Pa  at bedside. 

## 2013-12-27 NOTE — H&P (Signed)
28 yo G1 with history of ovarian cysts, currently on COC suppression, with acute onset abdominal pain w/ nausea and emesis starting 9 pm last night. Went to ER at midnight for persistent pain. Pt was suspicious of ovarian cyst as she has had prior.   In ER pt was neg UPT, WBC 10, nl Hgb. CT w/ nl appy, moderate stool burden, no lymphadenopathy. Prominent R adnexa on CT was followed by u/s showing 7x4x5 heterogenous R ovary w/ limited blood flow c/w partial torsion. Pt has been managed w/ IV narcotics th rough the night but continued pain.  Of note, pt is 5 wks from lumbar laminectomy.  PMH: Graves, ovarian cysts, back pain Past Medical History  Diagnosis Date  . Graves disease   . Ovarian cyst   . Ovarian torsion   . Herniated disc     PSH: lumbar laminectomy, elbow and ankle surgery Past Surgical History  Procedure Laterality Date  . Back surgery    . Laminectomy    . Ankle surgery    . Elbow surgery    . Back surgery      Meds: COC, methimazole All: benzoin  PE: Filed Vitals:   12/27/13 0846  BP: 125/76  Pulse: 86  Temp: 97.9 F (36.6 C)  Resp: 18   Gen: sitting in bed, hunched, noting abd pain Back: surgical scar present, no CVAT Abd: tender diffusely, voluntary guarding LE: NT, no edema GU: def to OR  CBC    Component Value Date/Time   WBC 10.9* 12/27/2013 0850   RBC 4.27 12/27/2013 0850   HGB 13.3 12/27/2013 0850   HCT 37.6 12/27/2013 0850   PLT 241 12/27/2013 0850   MCV 88.1 12/27/2013 0850   MCH 31.1 12/27/2013 0850   MCHC 35.4 12/27/2013 0850   RDW 11.5 12/27/2013 0850   LYMPHSABS 0.8 12/27/2013 0850   MONOABS 0.2 12/27/2013 0850   EOSABS 0.0 12/27/2013 0850   BASOSABS 0.0 12/27/2013 0850     Imaging: see results  A/P: Probable R ovarian torsion. D/w pt diagnostic l'scope with ovarian cystectomy (with possible RSO) vs expectant management with continued inpt observation. I d/w pt risks of RSO and possible fertility implications of this. We would only proceed w/  RSO if needed for bleeding, technically unable to proceed w/ cystectomy alone, concern for cancer, or absolutely no reperfusion of R ovary post torsion. If possible reperfusion clinically, I d/w pt option of RSO vs expectant and recommended the latter as most ovaries will improve over time. Pt aware risk of leaving ovary is needing a reoperation and agrees to this. We also discussed that if cystectomy alone, spillage of cyst fluid can cause adhesion formation and if cancer present can upstage. Also d/w pt that no torsion will be seen during OR but we would likely still proceed w/ cystectomy to prevent torsion as she is likely having intermittent torsion. Other non-surgical etiologies may be the cause of her pain (like her  Large stool burden) but given the u/s findings I recc proceeding to OR.  Dawn Maldonado Dawn Maldonado 12/27/2013 10:01 AM

## 2013-12-27 NOTE — Discharge Instructions (Signed)

## 2013-12-27 NOTE — ED Provider Notes (Signed)
Medical screening examination/treatment/procedure(s) were performed by non-physician practitioner and as supervising physician I was immediately available for consultation/collaboration.     Kaveon Blatz, MD 12/27/13 0636 

## 2013-12-27 NOTE — ED Notes (Signed)
Updated patient on wait for [place].

## 2013-12-27 NOTE — ED Notes (Signed)
C/o sharp RLQ pain since 9:30pm with nausea, vomited x 2, and diaphoresis.  Pt had back surgery 5 weeks ago.  C/o frequent urination with only a small amount of urine.

## 2013-12-27 NOTE — ED Notes (Signed)
Pt given Zofran and then started vomiting.

## 2013-12-27 NOTE — Anesthesia Preprocedure Evaluation (Signed)
Anesthesia Evaluation  Patient identified by MRN, date of birth, ID band Patient awake    Reviewed: Allergy & Precautions, H&P , Patient's Chart, lab work & pertinent test results, reviewed documented beta blocker date and time   History of Anesthesia Complications Negative for: history of anesthetic complications  Airway Mallampati: II TM Distance: >3 FB Neck ROM: full    Dental   Pulmonary  breath sounds clear to auscultation        Cardiovascular Exercise Tolerance: Good Rhythm:regular Rate:Normal     Neuro/Psych    GI/Hepatic   Endo/Other  Hyperthyroidism   Renal/GU      Musculoskeletal   Abdominal   Peds  Hematology   Anesthesia Other Findings   Reproductive/Obstetrics                           Anesthesia Physical Anesthesia Plan  ASA: II and emergent  Anesthesia Plan: General ETT   Post-op Pain Management:    Induction: Rapid sequence, Cricoid pressure planned and Intravenous  Airway Management Planned: Oral ETT  Additional Equipment:   Intra-op Plan:   Post-operative Plan:   Informed Consent: I have reviewed the patients History and Physical, chart, labs and discussed the procedure including the risks, benefits and alternatives for the proposed anesthesia with the patient or authorized representative who has indicated his/her understanding and acceptance.   Dental Advisory Given  Plan Discussed with: CRNA and Surgeon  Anesthesia Plan Comments:         Anesthesia Quick Evaluation

## 2013-12-27 NOTE — ED Notes (Signed)
Contrast completed. CT notified

## 2013-12-27 NOTE — Brief Op Note (Signed)
12/27/2013  7:53 PM  PATIENT:  Dawn Maldonado  28 y.o. female  PRE-OPERATIVE DIAGNOSIS: right  ovarian torsion, acute pelvic pain  POST-OPERATIVE DIAGNOSIS:  right  ovarian torsion, acute pelvic pain, necrotic heterogenous right ovary  PROCEDURE:  Procedure(s): LAPAROSCOPIC SALPINGO OOPHORECTOMY AND PELVIC WASHINGS (Right)  SURGEON:  Surgeon(s) and Role:    Tresa Endo A. Ernestina Penna, MD - Primary  PHYSICIAN ASSISTANT:   ASSISTANTSDenyse Amass, CNM   ANESTHESIA:   general  EBL:   per anasthesia records, minimal  BLOOD ADMINISTERED:none  DRAINS: none   LOCAL MEDICATIONS USED:  MARCAINE, 1/4%, 10 used at skin sites, 20 cc placed in peritoneal cavity at the end of the case  SPECIMEN:  Source of Specimen:  Right tube and ovary, pelvic washings  DISPOSITION OF SPECIMEN:  PATHOLOGY  COUNTS:  YES  TOURNIQUET:  * No tourniquets in log *  DICTATION: .Note written in EPIC  PLAN OF CARE: Discharge to home after PACU  PATIENT DISPOSITION:  PACU - hemodynamically stable.   Delay start of Pharmacological VTE agent (>24hrs) due to surgical blood loss or risk of bleeding: yes

## 2013-12-27 NOTE — ED Provider Notes (Signed)
PROGRESS NOTE                                                                                                                 This is a sign-out from PA Humes at shift change: Dawn Maldonado is a 28 y.o. female presenting with RLQ pain N/V, chills. Non surgical abd. Plan is to follow up US to r/o Torsion.  Please refer to previous note for full HPI, ROS, PMH and PE.   Emergent results called by radiologist to attending physician Dr. Judd Lien: Pelvic ultrasound shows enlargement and produced blood flow to right ovary, consistent with torsion.  GYN consult from Dr. Erin Fulling appreciated: Recommends immediate transfer. Patient informed, all questions answered. Advised patient to remain n.p.o., pain is still severe, abdomen remains nonsurgical. Last by mouth intake was last night at dinner. States she vomited up the food. Has had nothing in the last 12 hours.   Dawn Emery, PA-C 12/27/13 [phone]

## 2013-12-27 NOTE — Op Note (Signed)
12/27/2013  7:53 PM  PATIENT:  Dawn Maldonado  28 y.o. female  PRE-OPERATIVE DIAGNOSIS: right  ovarian torsion, acute pelvic pain  POST-OPERATIVE DIAGNOSIS:  right  ovarian torsion, acute pelvic pain, necrotic heterogenous right ovary  PROCEDURE:  Procedure(s): LAPAROSCOPIC SALPINGO OOPHORECTOMY AND PELVIC WASHINGS (Right)  SURGEON:  Surgeon(s) and Role:    Tresa Endo A. Ernestina Penna, MD - Primary  PHYSICIAN ASSISTANT:   ASSISTANTSDenyse Amass, CNM   ANESTHESIA:   general  EBL:   per anasthesia records, minimal  BLOOD ADMINISTERED:none  DRAINS: none   LOCAL MEDICATIONS USED:  MARCAINE, 1/4%, 10 used at skin sites, 20 cc placed in peritoneal cavity at the end of the case  SPECIMEN:  Source of Specimen:  Right tube and ovary, pelvic washings  DISPOSITION OF SPECIMEN:  PATHOLOGY  COUNTS:  YES  TOURNIQUET:  * No tourniquets in log *  DICTATION: .Note written in EPIC  PLAN OF CARE: Discharge to home after PACU  PATIENT DISPOSITION:  PACU - hemodynamically stable.   Delay start of Pharmacological VTE agent (>24hrs) due to surgical blood loss or risk of bleeding: yes   Antibiotics: 2g Ancef Complications: none  INDICATIONS: 28 yo G0 who presented to ED at 12 am with acute pelvic pain. Workup suspicious for acute ovarian torsion. Decision made to proceed to OR for diagnostic laparscopy, possible ovarian cystectomy, possible RSO.  Risks of procedure discussed with patient including bleeding, infection, damage to surrounding organs, fertility implications, possible X-lap and possible reoperation. Given concerns for acute ovarian torsion, decision for operative evaluation and treatment.     FINDINGS:  Normal uterus, normal left tube and ovary with small 1.5 cm simple fluid filled cyst, nl liver edge, nl appendix, moderate stool burden, R ovarian torsion involving R fallopian tube, non-perfusion of R ovary after detorsion, necrotic heterogenous R ovary, dilated R fallopiann with  reperfusion post torsion, dilated IP vessels that appeared thrombosed, peristalsing R ureter during and at the conclusion of the case.   TECHNIQUE:  The patient was taken to the operating room where general anesthesia was obtained without difficulty.  She was then placed in the dorsal lithotomy position prior to induction of anasthesia given her recent back surgery. She was prepared and draped in sterile fashion.  After an adequate timeout was performed, a foley catheter was placed and  a bimanual exam was done to assess the size and position of the uterus. A bivalved speculum was then placed in the patient's vagina, and the anterior lip of cervix grasped with the single-tooth tenaculum.  The cervix was sounded then serially dilated with Hegar dilators. However, the Zumi uterine manipulator was not able to be adequately advanced into the uterus. After several attempts, a Hulka tenaculum was easily placed. The speculum was removed from the vagina.  Attention was then turned to the patient's abdomen where a 10-mm skin incision was made on the umbilical fold after first injecting with 1/4% marcaine.  The Veress needle was carefully introduced into the peritoneal cavity through the abdominal wall.  Intraperitoneal placement was confirmed with a saline filled syringe and by drop in intraabdominal pressure with insufflation of carbon dioxide gas.  Adequate pneumoperitoneum was obtained, and the 10-mm non-bladed optiview trocar and sleeve were then advanced without difficulty into the abdomen where intraabdominal placement was confirmed by the operative laparoscope. A survey of the patient's pelvis and abdomen revealed findings as above.  5 mm skin incisions were made in the left and right lower quadrant after injecting with  anasthetic. Non-bladed trocars were placed under direct visualization. Using blunt and grasping instruments, anatomy was surveyed with findings as above.  The Right ovary appeared necrotic and  the IP was thrombosed. The left tube and ovary and uterus were normal. The ovary, fallopian tube, IP ligament and uterovarian ligament were all involved in the torsion. The right ureter was identified at the pelvic brim and its course was followed through the peritoneal wall. If was evaluated throughout and at the end of the case and was peristalsing throughout. About 45 minutes was allowed after detorsion, with loss of pneumo at times, with no recovery of the ovarian tissue. The fallopian tube did change color from purple to pink and it was thought that some blood flow to the tube was regained. The IP remained thrombosed. The right ovary was purple in hue with clot protruding from the distal end. No active bleeding was noted. Once detorsed,  the ovary continued to torse back on the the IP and the fallopian tube. While the pre-op u/s did not show any dominant cyst and no fluid filled cavity, instead one enlarged heterogenous ovary, it was apparent that unless I could remove an ovarian cyst or reduce the volume of the ovary, leaving the ovary would only result in repeat torsion. Usind monopolar cautery, I made an incision in the most fluctuant part of the ovary. No bleeding was noted. As a test of perfusion, the remainder the ovarian incision were made with sharp scissors and no cautery. No bleeding was noted. No cyst wall of cystic sac was noted- only heterogenous dark clot intermixed with necrotic ovarian tissue. 4 different sites of the ovary were incised in attempt to find a cyst for decompression. This was unsuccessful. The ovary continued to torse when not held in position. A small wedge biopsy was then done with the gyrus to see if decreasing ovarian volume would help prevent detorsion. It did not. Also, no cystic cavity or active bleeding was noted. After a total of 45 minutes without any active bleeding from the ovary, the inability to prevent repeat torsion, and no improvement in the appearance of the  ovary, as well as the continued thrombosed IP vessels, the decision was made to proceed with RSO. At first, consideration of R oophorectomy with retention of the R fallopain tube was considered, though there was very little space between the dilated IP and the dilated fallopain tube such that technically I was unable to proceed with retention of the fallopain tube. Additionally, given the thrombosed IP,  Decision was was to transect the IP higher up, at the level of the pelvic brim. The ureter was identified again and watched throughout this dissection. The IP was serially cauterized and transected, as was the utero-ovarian ligament and mesosalpynx. Hemostasis was noted.  The RSO was placed into an endocatch bag through the unbilical port after camera and instruments were changed. The endocatch bag was pulled up the umbilical incision and the trocar was removed. The ovary was larger than the non-bladed 10mm incision and was unable to be delivered. Using Kocher clamps to serially removed pieces of the tube and ovary from the endocatch bag, the volume was slowly reduced. This process proved very slow, due to the tearing of the dilated, necrotic, thrombosed tissue in the endocath bag. Using S retractors and Kocher clamps the fascial incision was extended slightly. This process continued for about 30 minutes. The skin incision was extended some to get adequate visualization in order to extend the fascial incision.  Eventually, the specimen was removed.   The pelvic was evaluated again, good peristalsis to the R ureter and hemostasis of all pedicles. Irrigation fluid was removed. 20cc 1/4% marcaine placed in the peritoneal cavity.   Pneumoperitoneum removed, sites still hemostatic. Instruments and ports removed.   Umbilical fascial incision closed with 0-vicryl in a running fashion using retractors for visualization. Marland Kitchen 4-0 vicryl used for skin incisions. Dermabond applied by nurse.   Hulka removed,  Repeat  speculum exam done and hemostasis noted. Catheter removed by nurse.   The patient tolerated the procedure well.  Sponge, lap, and needle counts were correct times two.  The patient was then taken to the recovery room awake, extubated and  in stable condition.  Harshith Pursell A. Joaquina Nissen 12/27/2013 10:50 PM  Late entry, case ended about 12:30p.

## 2013-12-27 NOTE — ED Notes (Signed)
Pt could not void at this time 

## 2013-12-27 NOTE — ED Notes (Signed)
Pt aware urine sample is needed 

## 2013-12-27 NOTE — Transfer of Care (Signed)
Immediate Anesthesia Transfer of Care Note  Patient: Dawn Maldonado  Procedure(s) Performed: Procedure(s): LAPAROSCOPIC SALPINGO OOPHORECTOMY (Right)  Patient Location: PACU  Anesthesia Type:General  Level of Consciousness: awake, alert  and oriented  Airway & Oxygen Therapy: Patient Spontanous Breathing and Patient connected to nasal cannula oxygen  Post-op Assessment: Report given to PACU RN and Post -op Vital signs reviewed and stable  Post vital signs: Reviewed and stable  Complications: No apparent anesthesia complications

## 2013-12-28 ENCOUNTER — Encounter (HOSPITAL_COMMUNITY): Payer: Self-pay | Admitting: Obstetrics

## 2013-12-28 LAB — GC/CHLAMYDIA PROBE AMP
CT Probe RNA: NEGATIVE
GC Probe RNA: NEGATIVE

## 2013-12-28 MED FILL — Heparin Sodium (Porcine) Inj 5000 Unit/ML: INTRAMUSCULAR | Qty: 1 | Status: AC

## 2014-01-03 NOTE — ED Provider Notes (Signed)
Medical screening examination/treatment/procedure(s) were performed by non-physician practitioner and as supervising physician I was immediately available for consultation/collaboration.     Geoffery Lyons, MD 01/03/14 [phone]

## 2014-01-12 ENCOUNTER — Ambulatory Visit: Payer: 59 | Attending: Neurosurgery | Admitting: Physical Therapy

## 2014-01-12 DIAGNOSIS — M545 Low back pain, unspecified: Secondary | ICD-10-CM | POA: Insufficient documentation

## 2014-01-12 DIAGNOSIS — R5381 Other malaise: Secondary | ICD-10-CM | POA: Insufficient documentation

## 2014-01-12 DIAGNOSIS — IMO0001 Reserved for inherently not codable concepts without codable children: Secondary | ICD-10-CM | POA: Insufficient documentation

## 2014-01-12 DIAGNOSIS — M25569 Pain in unspecified knee: Secondary | ICD-10-CM | POA: Insufficient documentation

## 2014-01-18 ENCOUNTER — Ambulatory Visit: Payer: 59 | Admitting: Physical Therapy

## 2014-01-20 ENCOUNTER — Ambulatory Visit: Payer: 59 | Admitting: Physical Therapy

## 2014-01-25 ENCOUNTER — Ambulatory Visit: Payer: 59 | Admitting: Physical Therapy

## 2014-01-27 ENCOUNTER — Ambulatory Visit: Payer: 59 | Admitting: Physical Therapy

## 2014-02-01 ENCOUNTER — Ambulatory Visit: Payer: 59 | Admitting: Physical Therapy

## 2014-02-03 ENCOUNTER — Ambulatory Visit: Payer: 59 | Attending: Neurosurgery | Admitting: Physical Therapy

## 2014-02-03 DIAGNOSIS — M25569 Pain in unspecified knee: Secondary | ICD-10-CM | POA: Insufficient documentation

## 2014-02-03 DIAGNOSIS — R5381 Other malaise: Secondary | ICD-10-CM | POA: Insufficient documentation

## 2014-02-03 DIAGNOSIS — IMO0001 Reserved for inherently not codable concepts without codable children: Secondary | ICD-10-CM | POA: Insufficient documentation

## 2014-02-03 DIAGNOSIS — M545 Low back pain, unspecified: Secondary | ICD-10-CM | POA: Insufficient documentation

## 2014-02-08 ENCOUNTER — Ambulatory Visit: Payer: 59 | Admitting: Physical Therapy

## 2014-02-10 ENCOUNTER — Ambulatory Visit: Payer: 59 | Admitting: Physical Therapy

## 2014-02-14 ENCOUNTER — Ambulatory Visit: Payer: 59 | Admitting: Physical Therapy

## 2014-02-15 ENCOUNTER — Encounter: Payer: 59 | Admitting: Physical Therapy

## 2014-02-16 ENCOUNTER — Ambulatory Visit: Payer: 59 | Admitting: Physical Therapy

## 2014-02-17 ENCOUNTER — Encounter: Payer: 59 | Admitting: Physical Therapy

## 2014-02-23 ENCOUNTER — Ambulatory Visit: Payer: 59 | Admitting: Physical Therapy

## 2014-02-25 ENCOUNTER — Ambulatory Visit: Payer: 59 | Admitting: Physical Therapy

## 2014-03-01 ENCOUNTER — Ambulatory Visit: Payer: 59 | Admitting: Physical Therapy

## 2014-03-03 ENCOUNTER — Encounter: Payer: 59 | Admitting: Physical Therapy

## 2014-03-08 ENCOUNTER — Encounter: Payer: 59 | Admitting: Physical Therapy

## 2014-04-21 ENCOUNTER — Other Ambulatory Visit (HOSPITAL_COMMUNITY): Payer: Self-pay | Admitting: Neurosurgery

## 2014-04-21 DIAGNOSIS — M5416 Radiculopathy, lumbar region: Secondary | ICD-10-CM

## 2014-04-29 ENCOUNTER — Ambulatory Visit (HOSPITAL_COMMUNITY)
Admission: RE | Admit: 2014-04-29 | Discharge: 2014-04-29 | Disposition: A | Discharge status: Home / Self Care | Payer: 59 | Source: Ambulatory Visit | Attending: Neurosurgery | Admitting: Neurosurgery

## 2014-04-29 DIAGNOSIS — M5416 Radiculopathy, lumbar region: Secondary | ICD-10-CM

## 2014-04-29 DIAGNOSIS — IMO0002 Reserved for concepts with insufficient information to code with codable children: Secondary | ICD-10-CM | POA: Diagnosis not present

## 2014-04-29 MED ORDER — GADOBENATE DIMEGLUMINE 529 MG/ML IV SOLN
15.0000 mL | Freq: Once | INTRAVENOUS | Status: AC | PRN
  Administered 2014-04-29: 12 mL via INTRAVENOUS

## 2014-10-10 ENCOUNTER — Encounter: Payer: Self-pay | Admitting: Sports Medicine

## 2014-10-10 ENCOUNTER — Ambulatory Visit (INDEPENDENT_AMBULATORY_CARE_PROVIDER_SITE_OTHER): Payer: 59 | Admitting: Sports Medicine

## 2014-10-10 VITALS — BP 132/80 | HR 88 | Ht 62.0 in | Wt 130.0 lb

## 2014-10-10 DIAGNOSIS — M357 Hypermobility syndrome: Secondary | ICD-10-CM

## 2014-10-10 DIAGNOSIS — Q796 Ehlers-Danlos syndrome: Secondary | ICD-10-CM

## 2014-10-10 DIAGNOSIS — M216X9 Other acquired deformities of unspecified foot: Secondary | ICD-10-CM

## 2014-10-10 NOTE — Progress Notes (Signed)
   Subjective:    Patient ID: Dawn Maldonado, female    DOB: 1985-12-10, 29 y.o.   MRN: [id number]  HPI chief complaint: Orthotics  Very pleasant 29 year old female with a past medical and surgical history significant for recent L5-S1 discectomy, oophorectomy, left ankle reconstruction, and hypermobility syndrome comes in today requesting custom orthotics. She has had chronic issues with her feet and ankles mainly due to her hypermobility. She has not had custom orthotics previously. She has tried off-the-shelf orthotics but feels like she needs more arch support. She is also complaining of some first MTP pain in the left foot. Worse with activity. Pain is diffuse around the joint. No swelling. No erythema. No history of gout. Medications reviewed Allergies reviewed    Review of Systems     Objective:   Physical Exam Well-developed, well-nourished. No acute distress.  Patient has a rigid cavus foot. There is separation between the first and second toes. Hypermobility appreciated at the first MTP joint bilaterally. No significant tenderness to palpation. No abnormal callus formation along the metatarsal heads. No swelling. No erythema. Patient pronates with walking. Neurovascular intact distally. Walking without a limp.  Beighton score is 9/9       Assessment & Plan:  Cavus foot Hypermobility syndrome  Custom orthotics were constructed today. A total of 40 minutes was spent with the patient with greater than 50% of the time spent in face-to-face consultation regarding orthotic construction, instruction, and fitting. Patient found them to be comfortable prior to leaving the office. I added a first ray post to the left orthotic to provide a little more support here. Patient will follow-up when necessary.  Patient was fitted for a : standard, cushioned, semi-rigid orthotic. The orthotic was heated and afterward the patient stood on the orthotic blank positioned on the orthotic stand. The  patient was positioned in subtalar neutral position and 10 degrees of ankle dorsiflexion in a weight bearing stance. After completion of molding, a stable base was applied to the orthotic blank. The blank was ground to a stable position for weight bearing. Size:6 Base: blue EVA Posting: Ist ray post on the left Additional orthotic padding: none

## 2014-10-19 ENCOUNTER — Ambulatory Visit (INDEPENDENT_AMBULATORY_CARE_PROVIDER_SITE_OTHER): Payer: 59 | Admitting: Sports Medicine

## 2014-10-19 DIAGNOSIS — M216X9 Other acquired deformities of unspecified foot: Secondary | ICD-10-CM

## 2014-10-20 NOTE — Progress Notes (Signed)
Patient ID: Dawn Maldonado, female   DOB: 06/23/1986, 29 y.o.   MRN: [id number]  Patient comes in today for adjustment to her orthotics. She is having a little bit of discomfort along the lateral aspect of her foot primarily towards the base of the fifth metatarsal. She also feels like she is supinating just a bit. She is concerned that her alignment is off and that her knee is going into genu valgus with walking. I shaved down a bit of the blue EVA along the lateral aspect of the orthotic. I also added a lateral post. Evaluation of her gait afterwards shows a rather neutral gait with no significant supination nor pronation. No obvious malalignment. She may have some slight dynamic genu valgus but not marked. She is working with both Dawn Maldonado as well as another physical therapist. She has been seeing them for quite some time. She will add some quad exercises to her home exercise routine and will return to the office in 4 weeks for reevaluation. She will let me know if she has questions or concerns in the interim.

## 2014-11-16 ENCOUNTER — Ambulatory Visit: Payer: 59 | Admitting: Sports Medicine

## 2015-02-13 ENCOUNTER — Encounter: Payer: Self-pay | Admitting: Sports Medicine

## 2015-02-13 ENCOUNTER — Ambulatory Visit (INDEPENDENT_AMBULATORY_CARE_PROVIDER_SITE_OTHER): Payer: 59 | Admitting: Sports Medicine

## 2015-02-13 VITALS — BP 119/76 | HR 81 | Ht 62.0 in | Wt 130.0 lb

## 2015-02-13 DIAGNOSIS — M216X9 Other acquired deformities of unspecified foot: Secondary | ICD-10-CM

## 2015-02-13 NOTE — Progress Notes (Signed)
   Subjective:    Patient ID: Dawn Maldonado, female    DOB: 1986-02-12, 29 y.o.   MRN: [id number]  HPI   Patient comes in today with persistent left foot pain. We made her some custom orthotics back in March but she states that her left foot has never "been right". She is still complaining of pain primarily along the lateral aspect of her foot that she isolates just proximal to the fifth metatarsal. We had initially tried to modify her original orthotics but unfortunately she continues to find them uncomfortable. She has a history of chronic ankle instability and hip weakness and has started working with physical therapy and feels like she is slowly but surely making progress. She has noticed that the orthotics have helped with her genu valgus.    Review of Systems     Objective:   Physical Exam Well-developed, well-nourished. No acute distress.  Left ankle: Full range of motion. 2-3+ talar tilt. Positive anterior drawer. No soft tissue swelling. No tenderness to palpation. Well-healed incision consistent with her prior ankle reconstruction. Neurovascular intact distally. There is minimal tenderness to palpation just proximal to the fifth metatarsal. No soft tissue swelling.  Evaluation of her feet in the standing position shows a cavus foot with collapse of the transverse arch. Evaluation of her walking shows her to initially supinate but she then quickly transitions into pronation in part due to her ankle laxity. She walks without a limp.       Assessment & Plan:  Persistent foot pain with chronic ankle instability Cavus foot  I suggested that we simply start with a new area of custom orthotics. Patient agrees.  Orthotics were constructed today and the patient felt like they were comfortable prior to leaving the office. We will schedule a follow-up in 2-3 weeks which she may cancel if her orthotics are doing well. I did discuss the possibility of a very small addition of blue poron  underneath the area of discomfort in her left insert if she continues to experience lateral foot pain. We had previously tried a lateral post which she found uncomfortable.  Patient was fitted for a : standard, cushioned, semi-rigid orthotic. The orthotic was heated and afterward the patient stood on the orthotic blank positioned on the orthotic stand. The patient was positioned in subtalar neutral position and 10 degrees of ankle dorsiflexion in a weight bearing stance. After completion of molding, a stable base was applied to the orthotic blank. The blank was ground to a stable position for weight bearing. Size: 6 Base: blue EVA Posting: none Additional orthotic padding: none

## 2015-02-27 ENCOUNTER — Encounter: Payer: Self-pay | Admitting: Sports Medicine

## 2015-02-27 ENCOUNTER — Ambulatory Visit (INDEPENDENT_AMBULATORY_CARE_PROVIDER_SITE_OTHER): Payer: 59 | Admitting: Sports Medicine

## 2015-02-27 VITALS — BP 112/73 | Ht 62.0 in | Wt 122.0 lb

## 2015-02-27 DIAGNOSIS — M216X9 Other acquired deformities of unspecified foot: Secondary | ICD-10-CM | POA: Diagnosis not present

## 2015-02-27 NOTE — Progress Notes (Signed)
Patient ID: Dawn Maldonado, female   DOB: 1985-10-10, 29 y.o.   MRN: [id number]  Patient comes in today for follow-up. She is still unhappy with the orthotics that we have made. She feels like the arch in the left foot is a little too far forward. She feels like she needs a little more support toward the posterior arch. 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 She denies any pain today along the lateral aspect of her foot. She denies any problem with the right orthotic.  I agreed to make her new orthotics today. I tried to mold more arch support toward the posterior aspect of her arch in the left orthotic. We will not add any additional padding or posting to this. As on previous visits she stated that this was comfortable but she stated that she would not really know until she was able to try them out. She will follow-up in 2-3 weeks. Of note, evaluation of her walking gait after orthotic construction showed a neutral gait.  Total time spent with the patient was 30 minutes with greater than 50% of the time spent in face-to-face consultation discussing orthotic construction, instruction, and fitting. I explained to the patient that orthotics are just a small part of her chronic low back pain and ankle instability. She seems to understand this.  Patient was fitted for a : standard, cushioned, semi-rigid orthotic. The orthotic was heated and afterward the patient stood on the orthotic blank positioned on the orthotic stand. The patient was positioned in subtalar neutral position and 10 degrees of ankle dorsiflexion in a weight bearing stance. After completion of molding, a stable base was applied to the orthotic blank. The blank was ground to a stable position for weight bearing. Size:6 Base: blue EVA Posting: none Additional orthotic padding: none

## 2015-03-13 ENCOUNTER — Ambulatory Visit: Payer: 59 | Admitting: Sports Medicine

## 2015-04-08 IMAGING — CT CT ABD-PELV W/ CM
2 of 4 series · 16 of 46 positions shown, 18 images · IV contrast (Omni 300)
Comparison: DG LUMBAR SPINE 1V dated 11/18/2013; MR FAREED SPINE W/O
dated 11/05/2013

CLINICAL DATA: Right lower quadrant pain.

EXAM:
CT ABDOMEN AND PELVIS WITH CONTRAST
TECHNIQUE: Multidetector CT imaging of the abdomen and pelvis was performed
using the standard protocol following bolus administration of
intravenous contrast.
CONTRAST:  80mL OMNIPAQUE IOHEXOL 300 MG/ML SOLN, 25mL OMNIPAQUE
IOHEXOL 300 MG/ML SOLN

[Series 2: abd/ pelvis 5.0 i30f 1 · axial · 0.63mm/px · z∈[+774,+1209]mm · 13 of 95 slices shown, 15 images]
[im 4/95  soft-tissue]
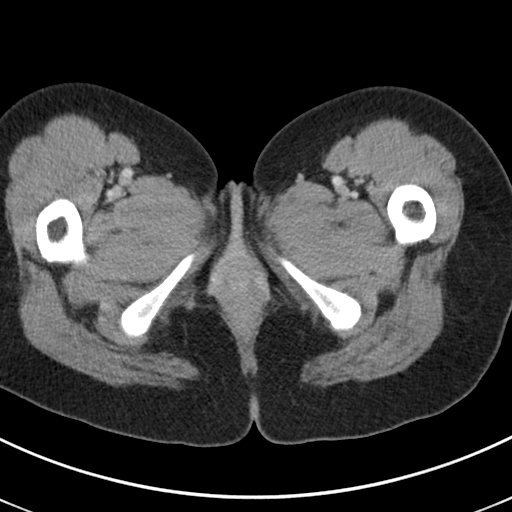
[im 4/95  bone]
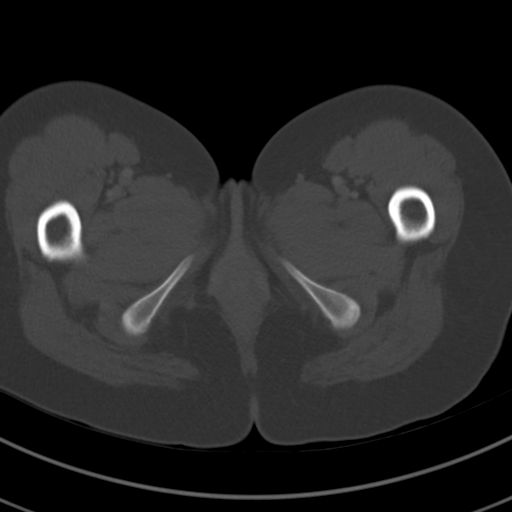
[im 12/95  soft-tissue]
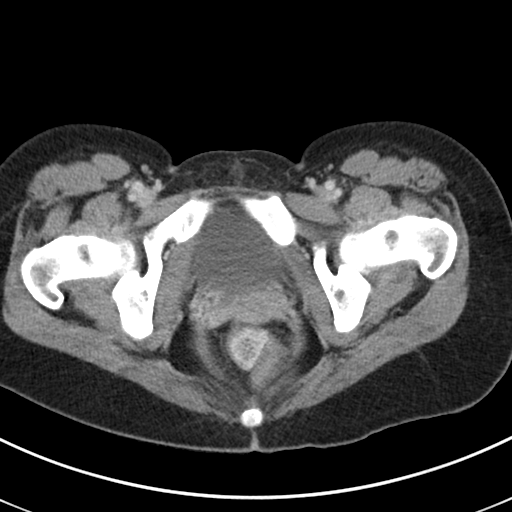
[im 20/95  soft-tissue]
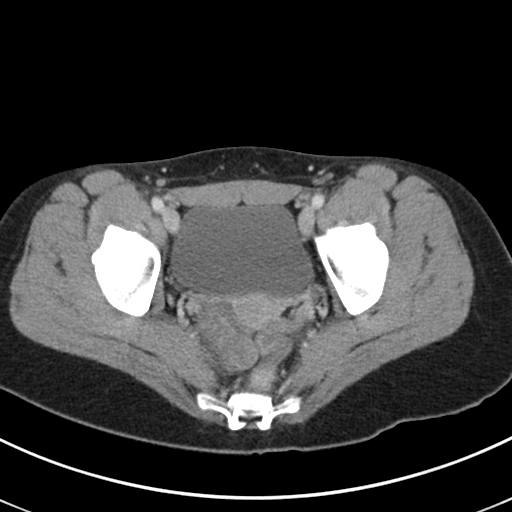
[im 28/95  soft-tissue]
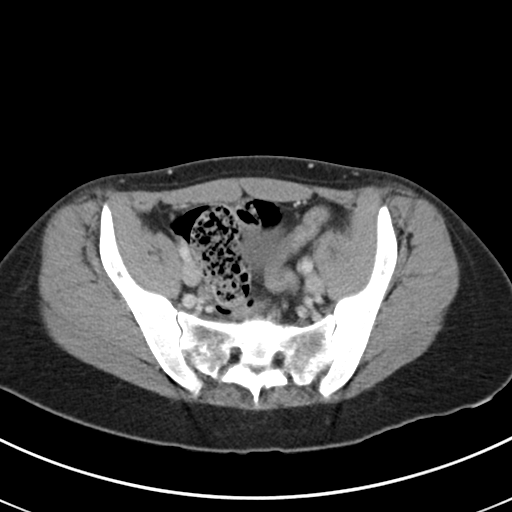
[im 32/95  soft-tissue]
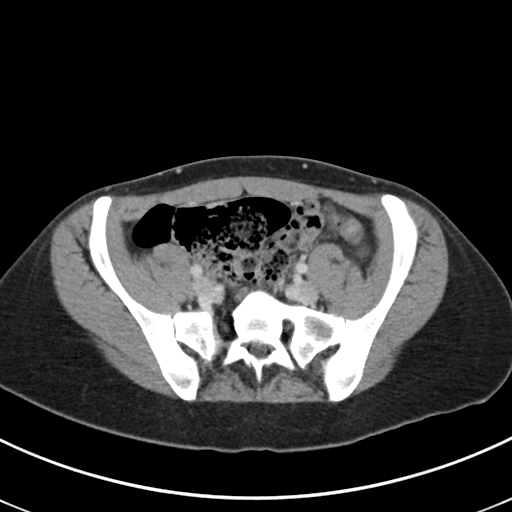
[im 40/95  soft-tissue]
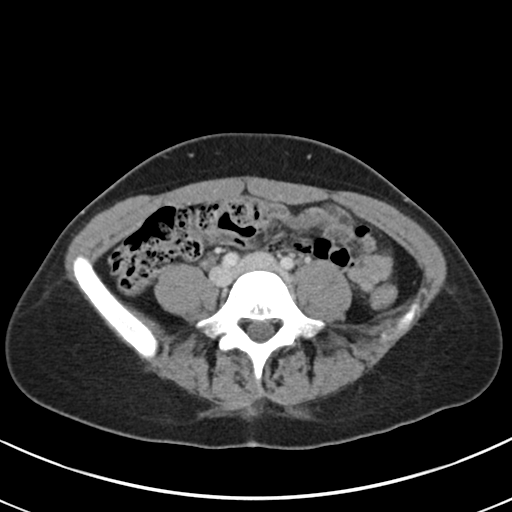
[im 48/95  soft-tissue]
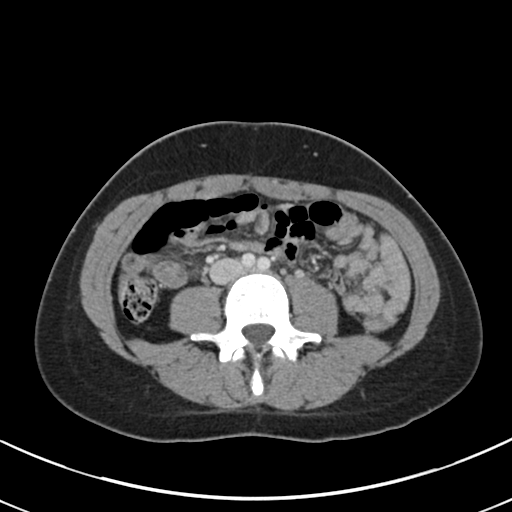
[im 55/95  soft-tissue]
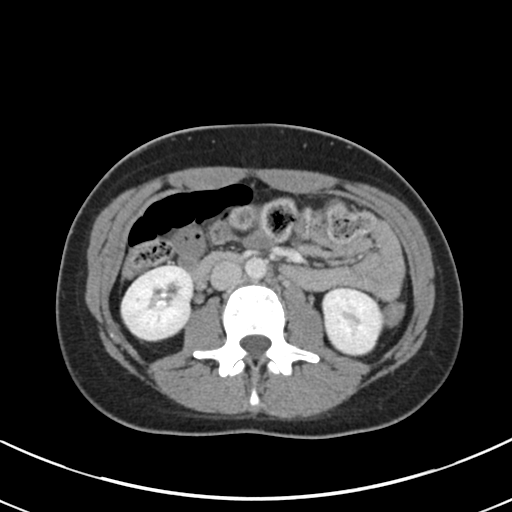
[im 63/95  soft-tissue]
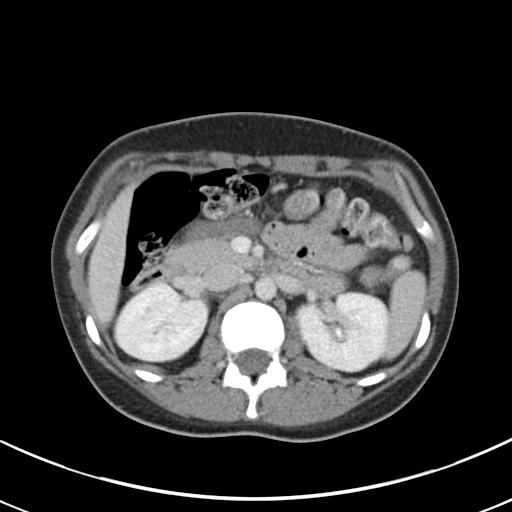
[im 63/95  bone]
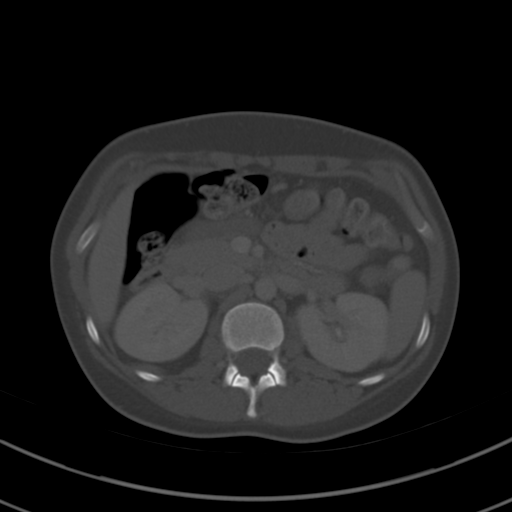
[im 67/95  soft-tissue]
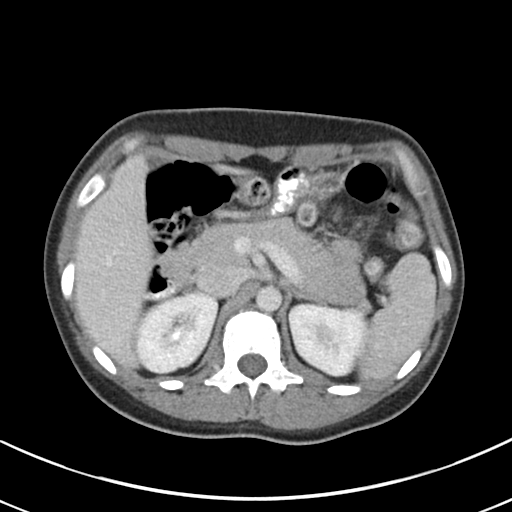
[im 75/95  soft-tissue]
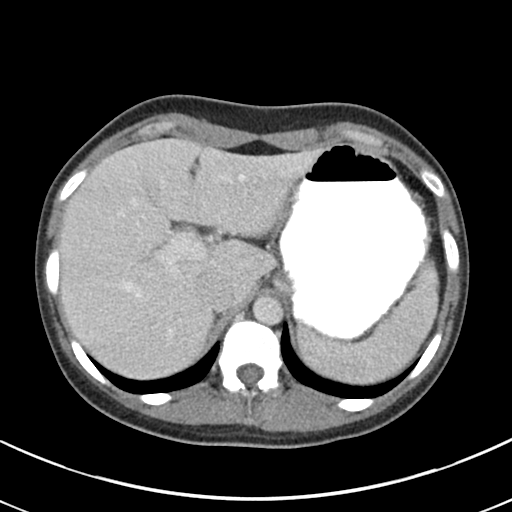
[im 83/95  soft-tissue]
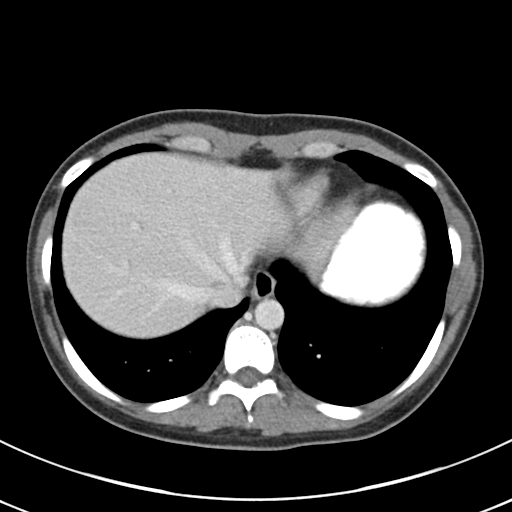
[im 91/95  soft-tissue]
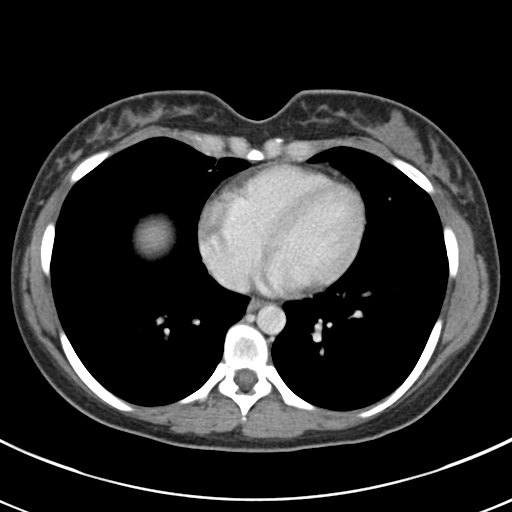

[Series 5: coronals · coronal · 0.70mm/px · 3 of 105 slices shown]
[im 35/105  soft-tissue]
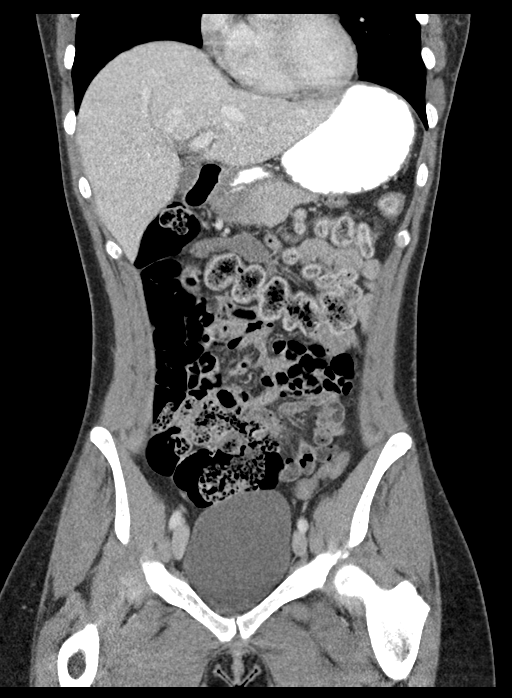
[im 47/105  soft-tissue]
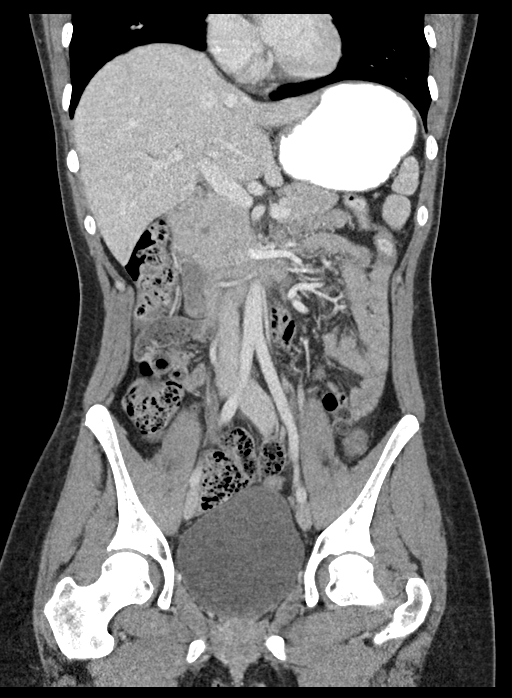
[im 58/105  soft-tissue]
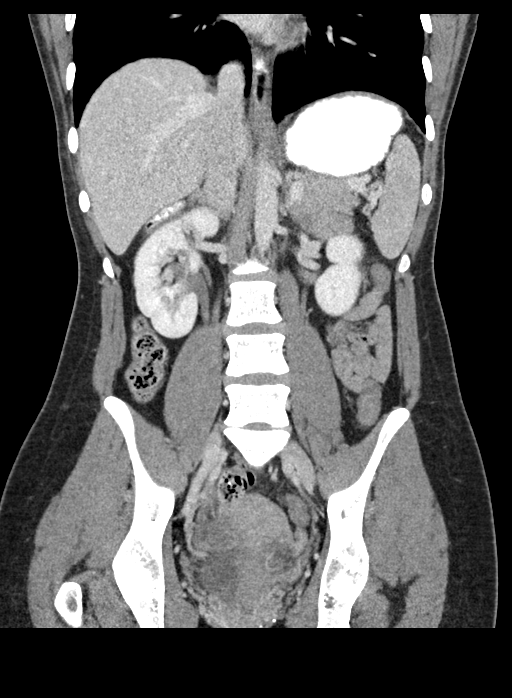

[16 of 46 positions shown; findings below may reference images not displayed]

FINDINGS: Included view of the lung bases are clear. Visualized heart and
pericardium are unremarkable.

The liver, spleen, gallbladder, pancreas and adrenal glands are
unremarkable.

The stomach, small and large bowel are normal in course and caliber
without inflammatory changes. Moderate amount of retained large
bowel stool. Enteric contrast has not yet reached the distal small
bowel. Normal air-filled appendix. No intraperitoneal free fluid nor
free air.

Kidneys are orthotopic, demonstrating symmetric enhancement without
nephrolithiasis, hydronephrosis or renal masses. The unopacified
ureters are normal in course and caliber. Urinary bladder is
partially distended and unremarkable.

Aortoiliac vessels are normal in course and caliber. No
lymphadenopathy by CT size criteria. The right ovary appears
somewhat prominent at 4.9 x 3.9 cm. Grade 1 L5-S1 retrolisthesis
with moderate L5-S1 degenerative disc disease, status post right L5
laminectomy.
IMPRESSION: Mildly prominent right adnexae, if clinically indicated this could
be further characterized with pelvic ultrasound.

Moderate amount of retained large bowel stool.  Normal appendix.

  By: Corazon Hibbs

## 2015-04-08 IMAGING — US US PELVIS COMPLETE
1 series · 13 of 25 positions shown · non-contrast
Comparison: CT scan dated 12/27/2013

CLINICAL DATA: Right lower quadrant abdominal pain.

EXAM:
TRANSABDOMINAL AND TRANSVAGINAL ULTRASOUND OF PELVIS
DOPPLER ULTRASOUND OF OVARIES
TECHNIQUE: Both transabdominal and transvaginal ultrasound examinations of the
pelvis were performed. Transabdominal technique was performed for
global imaging of the pelvis including uterus, ovaries, adnexal
regions, and pelvic cul-de-sac.
It was necessary to proceed with endovaginal exam following the
transabdominal exam to visualize the ovaries and uterus. Color and
duplex Doppler ultrasound was utilized to evaluate blood flow to the
ovaries.

[Series 1: us pelvis complete · 0.14mm/px · 82 acquisitions, 13 frames shown]
[im 1/82]
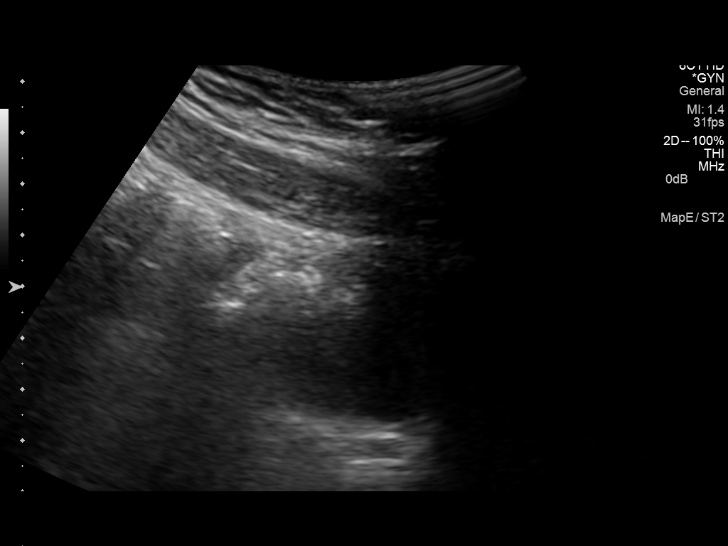
[im 7/82]
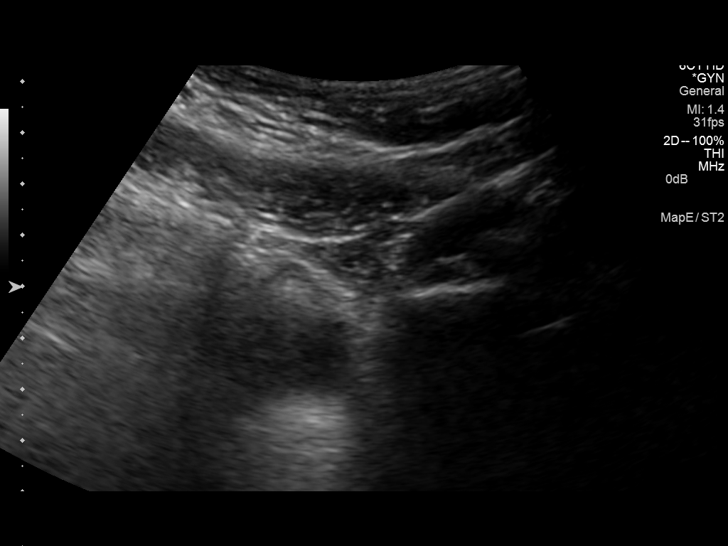
[im 14/82]
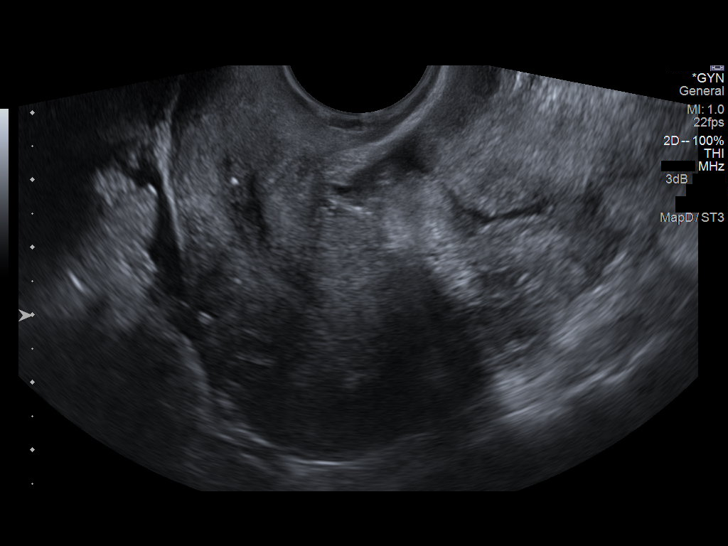
[im 21/82]
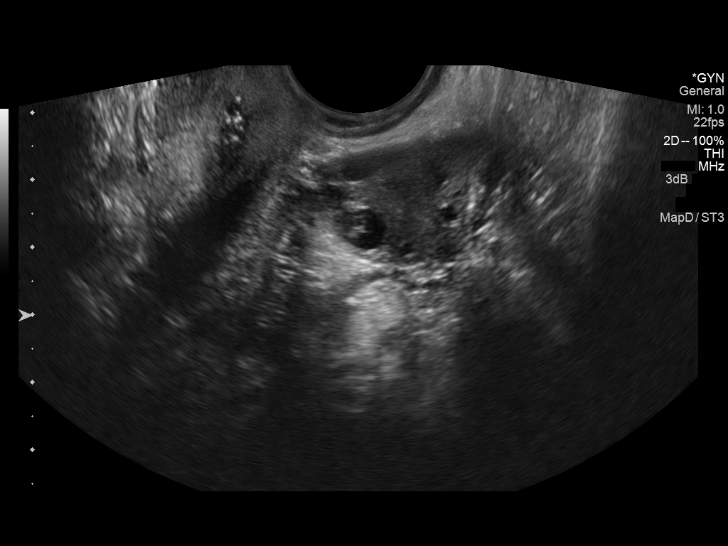
[im 28/82]
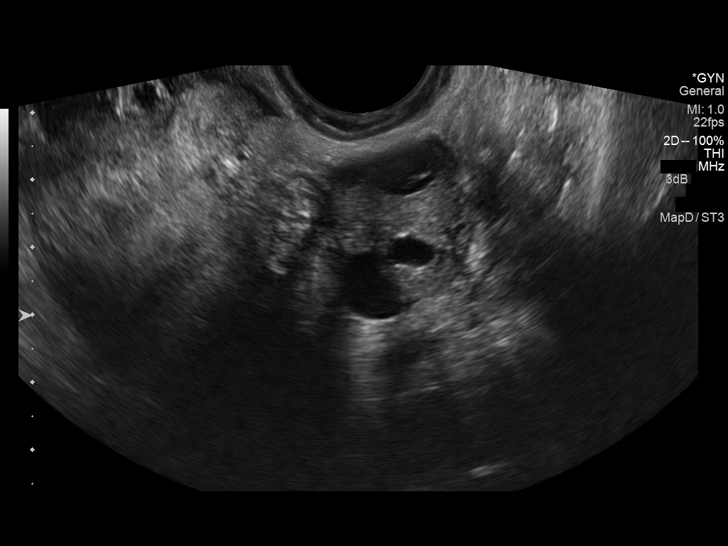
[im 34/82]
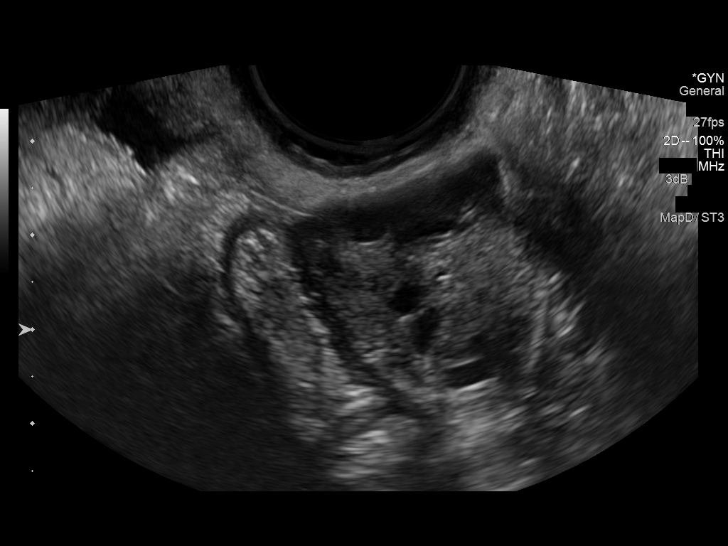
[im 41/82]
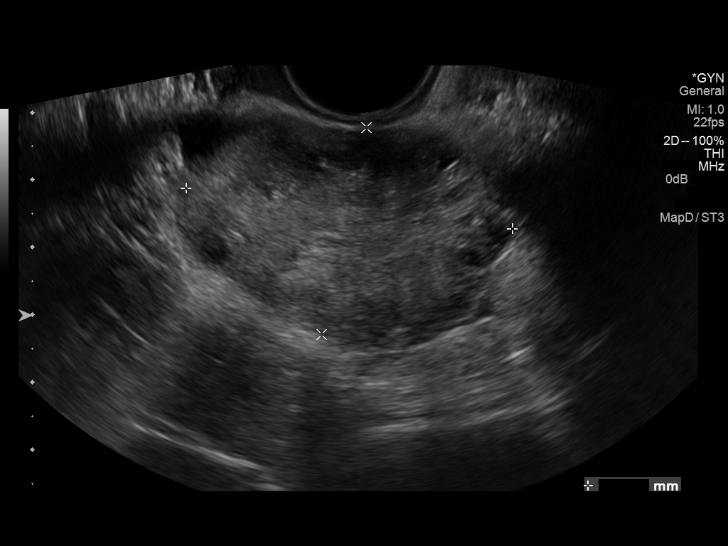
[im 48/82]
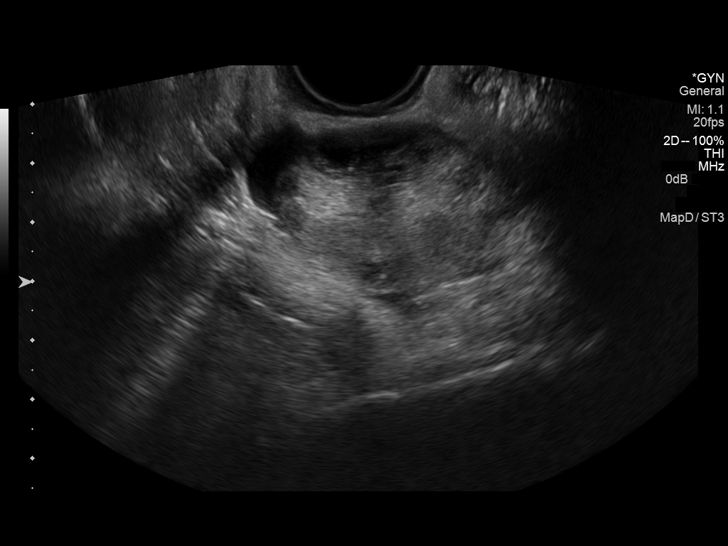
[im 55/82]
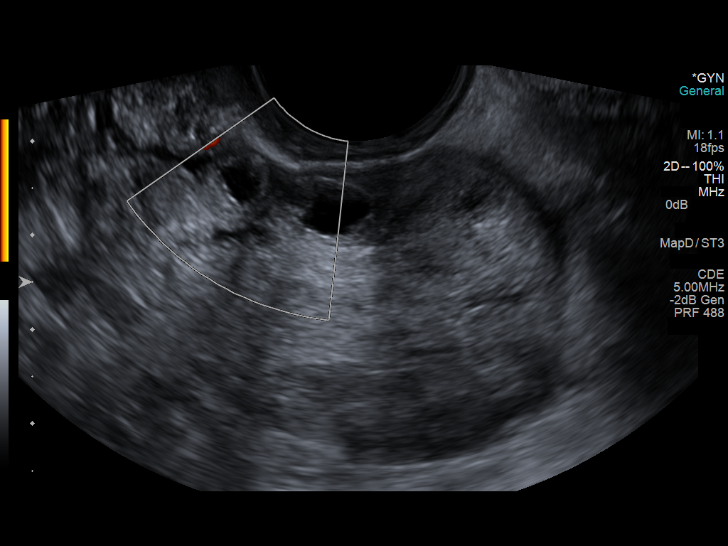
[im 61/82]
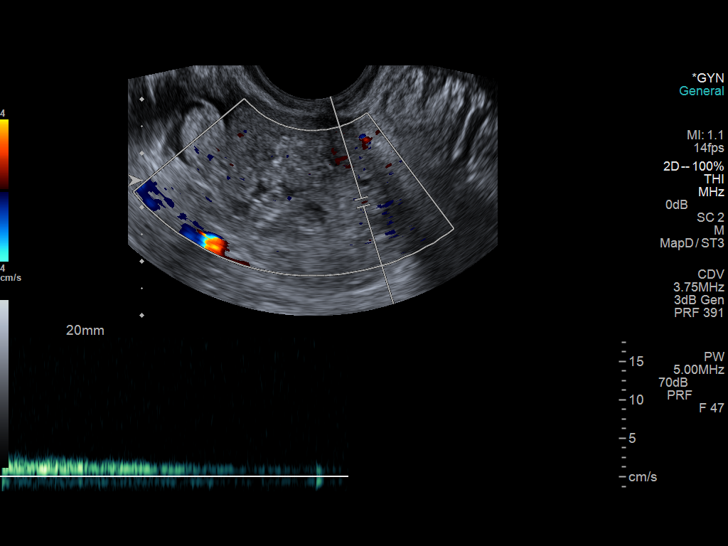
[im 68/82]
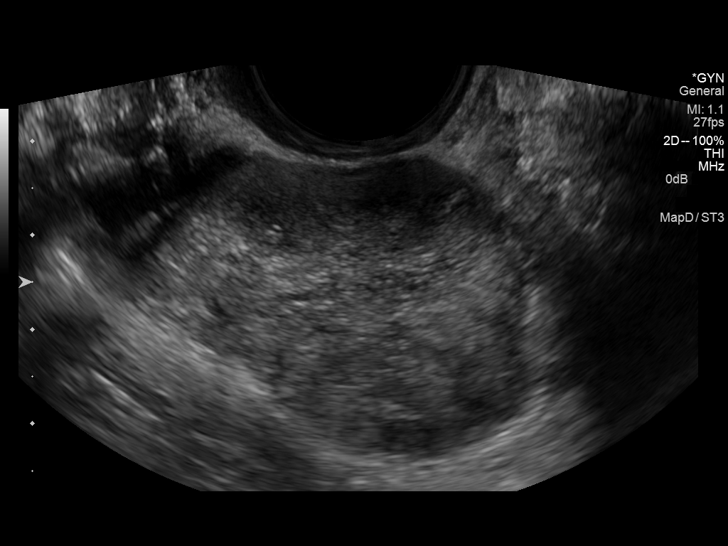
[im 75/82]
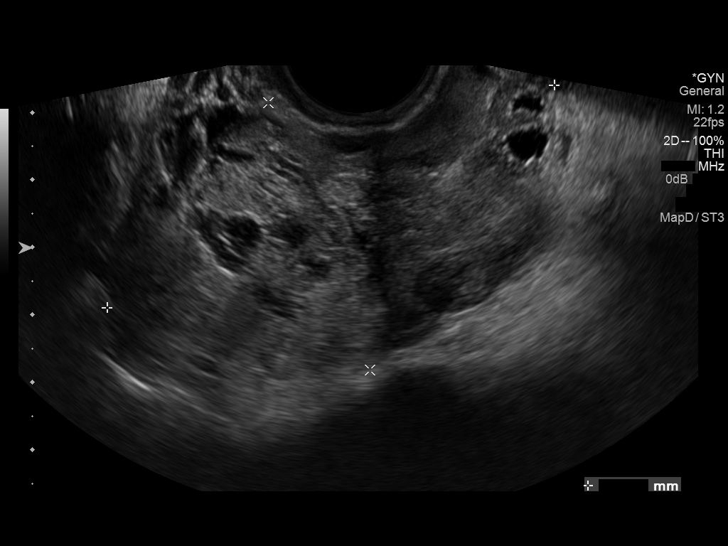
[im 82/82]
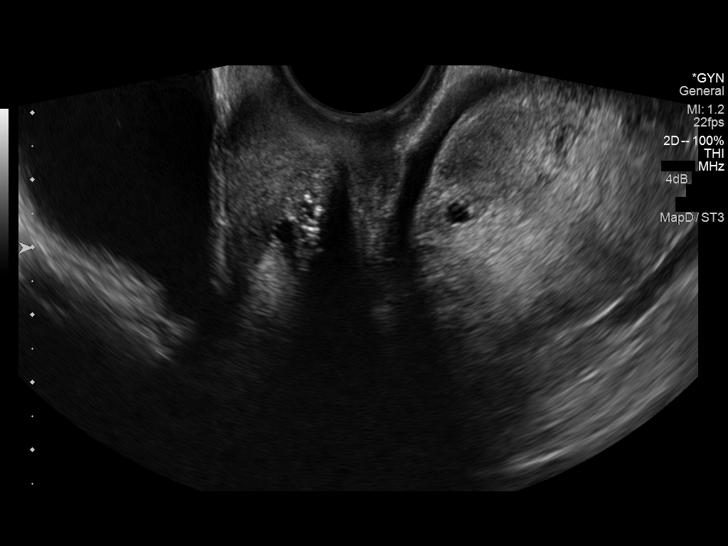

[13 of 25 positions shown; findings below may reference images not displayed]

FINDINGS: Uterus

Measurements: 5.6 x 3.4 x 3.9 cm. No fibroids or other mass
visualized.

Endometrium

Thickness: 4.1 mm.  No focal abnormality visualized.

Right ovary

Measurements: 7.4 x 4.2 x 5.9 cm. The ovary is enlarged and
heterogeneous with minimal blood flow. I suspect this represents a
partial torsion.

Left ovary

Measurements: 3.2 x 2.1 x 2.3 cm. Simple appearing 2.1 cm cyst..
Normal perfusion.

Other findings:  Small amount of free fluid around the right ovary.
IMPRESSION: Enlarged right ovary with poor blood flow consistent with partial
tortion.

Critical Value/emergent results were called by telephone at the time
of interpretation on 12/27/2013 at [DATE] to Dr. Aretha

, who verbally acknowledged these results.

## 2015-08-08 DIAGNOSIS — E0500 Thyrotoxicosis with diffuse goiter without thyrotoxic crisis or storm: Secondary | ICD-10-CM | POA: Diagnosis not present

## 2015-08-08 DIAGNOSIS — H5213 Myopia, bilateral: Secondary | ICD-10-CM | POA: Diagnosis not present

## 2015-09-04 DIAGNOSIS — M5417 Radiculopathy, lumbosacral region: Secondary | ICD-10-CM | POA: Diagnosis not present

## 2015-09-04 DIAGNOSIS — M542 Cervicalgia: Secondary | ICD-10-CM | POA: Diagnosis not present

## 2015-09-04 DIAGNOSIS — M545 Low back pain: Secondary | ICD-10-CM | POA: Diagnosis not present

## 2015-09-05 DIAGNOSIS — B370 Candidal stomatitis: Secondary | ICD-10-CM | POA: Diagnosis not present

## 2015-09-05 MED FILL — FLUCONAZOLE 150 MG TABLET: 150 | 1 days supply | Qty: 1 | Fill #0

## 2015-09-05 MED FILL — NYSTATIN 100,000 UNITS/ML S: 100000 | 5 days supply | Qty: 240 | Fill #0

## 2015-09-20 MED FILL — LARIN 21 1-20 TABLET: 1-20 | 84 days supply | Qty: 63 | Fill #3

## 2015-10-23 DIAGNOSIS — N943 Premenstrual tension syndrome: Secondary | ICD-10-CM | POA: Diagnosis not present

## 2015-10-24 DIAGNOSIS — E0500 Thyrotoxicosis with diffuse goiter without thyrotoxic crisis or storm: Secondary | ICD-10-CM | POA: Diagnosis not present

## 2015-10-31 DIAGNOSIS — E0500 Thyrotoxicosis with diffuse goiter without thyrotoxic crisis or storm: Secondary | ICD-10-CM | POA: Diagnosis not present

## 2015-12-01 DIAGNOSIS — Z6823 Body mass index (BMI) 23.0-23.9, adult: Secondary | ICD-10-CM | POA: Diagnosis not present

## 2015-12-01 DIAGNOSIS — Z01419 Encounter for gynecological examination (general) (routine) without abnormal findings: Secondary | ICD-10-CM | POA: Diagnosis not present

## 2015-12-01 MED FILL — LARIN 21 1-20 TABLET: 1-20 | 84 days supply | Qty: 63 | Fill #0

## 2015-12-22 DIAGNOSIS — M797 Fibromyalgia: Secondary | ICD-10-CM | POA: Diagnosis not present

## 2015-12-22 DIAGNOSIS — M357 Hypermobility syndrome: Secondary | ICD-10-CM | POA: Diagnosis not present
